# Patient Record
Sex: Female | Born: 1980 | Race: White | Hispanic: No | Marital: Single | Smoking: Never smoker
Health system: Southern US, Community
[De-identification: ages and names within clinical notes are randomized; demographics above are authoritative.]

## PROBLEM LIST (undated history)

## (undated) DIAGNOSIS — J302 Other seasonal allergic rhinitis: Secondary | ICD-10-CM

## (undated) DIAGNOSIS — M2652 Limited mandibular range of motion: Secondary | ICD-10-CM

## (undated) DIAGNOSIS — J45909 Unspecified asthma, uncomplicated: Secondary | ICD-10-CM

## (undated) DIAGNOSIS — M25371 Other instability, right ankle: Secondary | ICD-10-CM

## (undated) DIAGNOSIS — F329 Major depressive disorder, single episode, unspecified: Secondary | ICD-10-CM

## (undated) DIAGNOSIS — F419 Anxiety disorder, unspecified: Secondary | ICD-10-CM

## (undated) DIAGNOSIS — I10 Essential (primary) hypertension: Secondary | ICD-10-CM

## (undated) DIAGNOSIS — F909 Attention-deficit hyperactivity disorder, unspecified type: Secondary | ICD-10-CM

## (undated) DIAGNOSIS — F32A Depression, unspecified: Secondary | ICD-10-CM

## (undated) HISTORY — DX: Major depressive disorder, single episode, unspecified: F32.9

## (undated) HISTORY — DX: Depression, unspecified: F32.A

## (undated) HISTORY — DX: Attention-deficit hyperactivity disorder, unspecified type: F90.9

## (undated) HISTORY — DX: Anxiety disorder, unspecified: F41.9

## (undated) HISTORY — PX: LASIK: SHX215

---
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
## 2002-05-07 ENCOUNTER — Observation Stay (HOSPITAL_COMMUNITY): Admission: RE | Admit: 2002-05-07 | Discharge: 2002-05-08 | Payer: Self-pay | Admitting: Oral Surgery

## 2002-05-07 HISTORY — PX: MANDIBULAR SAGITTAL SPLIT OSTEOTOMY W/ INTERNAL FIXATION: SHX1998

## 2002-05-07 HISTORY — PX: MAXILLARY LE FORTE I OSTEOTOMY: SHX2005

## 2006-09-27 ENCOUNTER — Emergency Department (HOSPITAL_COMMUNITY): Admission: EM | Admit: 2006-09-27 | Discharge: 2006-09-27 | Payer: Self-pay | Admitting: Emergency Medicine

## 2008-11-21 ENCOUNTER — Emergency Department (HOSPITAL_BASED_OUTPATIENT_CLINIC_OR_DEPARTMENT_OTHER): Admission: EM | Admit: 2008-11-21 | Discharge: 2008-11-21 | Payer: Self-pay | Admitting: Emergency Medicine

## 2009-07-25 ENCOUNTER — Ambulatory Visit (HOSPITAL_COMMUNITY): Admission: RE | Admit: 2009-07-25 | Discharge: 2009-07-25 | Payer: Self-pay | Admitting: Surgery

## 2009-07-25 ENCOUNTER — Encounter (INDEPENDENT_AMBULATORY_CARE_PROVIDER_SITE_OTHER): Payer: Self-pay | Admitting: Surgery

## 2009-07-25 HISTORY — PX: CHOLECYSTECTOMY: SHX55

## 2010-07-07 ENCOUNTER — Emergency Department (HOSPITAL_COMMUNITY): Admission: EM | Admit: 2010-07-07 | Discharge: 2010-07-07 | Payer: Self-pay | Admitting: Emergency Medicine

## 2011-02-26 LAB — HEMOGLOBIN AND HEMATOCRIT, BLOOD
HCT: 41.5 % (ref 36.0–46.0)
Hemoglobin: 13.8 g/dL (ref 12.0–15.0)

## 2011-02-26 LAB — PREGNANCY, URINE: Preg Test, Ur: NEGATIVE

## 2011-04-09 NOTE — Discharge Summary (Signed)
[facility]  Patient:    Joy Walters, SEDAM Visit Number: [id number] MRN: [id number]          Service Type: SUR Location: 4W 0455 01 Attending Physician:  Luis Abed Dictated by:   Dionne Ano. Gwyneth Sprout., D.D.S. Admit Date:  05/07/2002 Discharge Date: 05/08/2002                             Discharge Summary  HISTORY OF PRESENT ILLNESS:  This 30 year old female was admitted to [facility] on 05/07/02, with a primary diagnosis of maxillary transverse deficiency, maxillary asymmetry, maxillary camp, mandibular sagittal deficiency, mandibular asymmetry, with severe class III malocclusion.  Her history and physical was completed showing radiographic and clinical evidence of severe class III malocclusion, unable to be treated with orthodontic means alone, and for a complete review of the physical findings, see the admission note.  ADMISSION LABORATORY DATA:  Her red blood cells were 4.81, hemoglobin 14.0, hematocrit 41.6.  She was typed as RH negative.  ABO group O, and antibody screen was negative.  SURGERY:  The patient was taken to surgery on the day of admission, and under general anesthesia an LeFort I maxillary osteotomy with advancement in rotation and rigid fixation was completed and a bilateral sagittal split osteotomy mandible with posterior repositioning and rotation was completed. The patient tolerated the surgery and the anesthesia without complications.  HOSPITAL COURSE:  Postoperatively, she has been taking fluid well, and ambulating with assistance, and is now discharged to be followed as an outpatient in our office.  Home care instructions have been reviewed with the patient and her mother and father, including complete dietary instructions for full liquid diet, meticulous oral hygiene, and activity is limited to light activity with supervision.  DISCHARGE MEDICATIONS: 1. Keflex 500 mg one tablet q.i.d. 2.  Percocet one tablet q.4h. p.r.n. pain. 3. Allegra one tablet b.i.d. 4. Decadron one tablet t.i.d. 5. Peridex oral rinse 10 cc rinse and spit out b.i.d.  FINAL DIAGNOSES: 1. 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 2. Maxillary asymmetry with deviation of the maxillary midline to the left. 3. Maxillary camp with the right side being lower than the left side. 4. 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 5. Mandibular asymmetry with deviation of the mandibular skeletal and midline    to the right. Dictated by:   Dionne Ano. Gwyneth Sprout., D.D.S. Attending Physician:  Luis Abed DD:  05/08/02 TD:  05/09/02 Job: [id number]

## 2011-04-09 NOTE — Op Note (Signed)
[facility]  Patient:    Joy Walters, Joy Walters Visit Number: [id number] MRN: [id number]          Service Type: SUR Location: 4W 0455 01 Attending Physician:  Luis Abed Dictated by:   Dionne Ano. Gwyneth Sprout., D.D.S. Proc. Date: 05/07/02 Admit Date:  05/07/2002 Discharge Date: 05/08/2002                             Operative Report  PREOPERATIVE DIAGNOSES: 1. Maxillary sagittal deficiency with class III malocclusion. 2. Maxillary asymmetry with deviation of maxillary midline to the left. 3. Maxillary cant with the right side being lower than the left side. 4. 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 5. Mandibular asymmetry with deviation of the mandibular midline to the right.  POSTOPERATIVE DIAGNOSES: 1. Maxillary sagittal deficiency with class III malocclusion. 2. Maxillary asymmetry with deviation of maxillary midline to the left. 3. Maxillary cant with the right side being lower than the left side. 4. 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 5. Mandibular asymmetry with deviation of the mandibular midline to the right.  PROCEDURE:  Jerry Caras I maxillary osteotomy with advancement and rigid fixation and bilateral sagittal split osteotomy to the mandible with posterior repositioning, rotation, and rigid fixation.  SURGEON:  Dionne Ano. Gwyneth Sprout., D.D.S. and Cherly Anderson, D.D.S.  INDICATION FOR PROCEDURE:  This 30 year old female has been followed as an outpatient in our office and has been undergoing orthodontic care with Dr. Rosanne Ashing ______.  After orthodontic alignment of the maxillary and mandibular teeth in each individual chart, cephalometric x-ray analysis, surgical analysis, model analysis was completed, indicating she has severe class III malocclusion that could not be corrected with orthodontic procedures alone with an underbite of greater than 11 mm.  She also shows significant asymmetry of the  maxilla and mandible which could also not be addressed with orthodontics alone.  Presurgical study models were completed.  Simulations of the surgical procedures were completed, and the surgical splints were fabricated for the surgical procedure.  PROCEDURE AND FINDINGS:  The patient brought to the operating suite and placed supine and positioned on the operating table.  After satisfactory nasal tracheal anesthesia was achieved, the patients oral cavity was prepped by placing a ______  throat pack into the oropharynx and prepped with a Betadine scrub and Betadine paint, and the face was painted with Betadine.  The patient was draped with four towels but no lap sheet.  Bilateral inferior alveolar and lingula nerve blocks were achieved for postoperative comfort and hemostasis.  Using electrocautery, an incision was made along the external oblique ridge of the right mandible extending from the mid portion of the anterior lead to the second molar tooth.  The incision was carried through the mucosa, submucosa, buccinator muscle periosteum down to bone.  Full-thickness mucoperiosteal flap was elevated superiorly to the glenoid process and medially, identifying the lingula of the mandible and inferior to the inferior border of the mandible and the first molar area. Using a rotary osteotome with the [facility] bur, a horizontal bone cut was made just superior to the lingula through the medial cortical plate, and a sagittal cut was completed with a 701 bur between the external and internal oblique ridges, and anteriorly and inferiorly at the second molar tooth.  Then a vertical cut was made through the inferior border of the mandible and along the lateral cortex of the mandible superiorly to connect with the  sagittal cut. Both sides were completed in a similar fashion.  No attempt was made to decide who would split the mandible at this point.  Attention was turned to the maxilla and  posterosuperior alveolar nerve block was completed with 0.5% Marcaine with 1:200,000 epinephrine and infiltration along the lateral aspect of the maxilla for hemostasis.  Using electrocautery, incision was made through the mucosa, submucosa, buccinator muscle. Periosteum down to bone from the right zygomatic buttress anteriorly to the midline completed on both sides.  The periosteum was stripped superiorly along the piriform rim and to the infraorbital foramen and posterior long the posterior wall with axilla to the pterygoid place.  This was completed on both sides, and marking holes were placed on the piriform rim and the zygomatic buttress at exactly 10 mm apart, and a horizontal bone cut was made to the axilla above the apices of the teeth approximately 10 mm through the lateral wall of the fattest to connect with the lateral wall of the nose parallel to the plane of occlusion.  A posterior bone cut was made on both left and right side using the sagittal saw to the pterygoid plates.  Mallet and chisel were then used to separate the medial wall of the maxilla to the pterygoid plates. A nasoseptal chisel was used to remove the nasal septum and vomer bone from the perpendicular plate of the palate and then a pterygomaxillary chisel was used to separate the pterygoid plates from the posterior wall of the maxilla. This allowed the maxilla to be down-fractured in the traditional sense and mobilized with Tessier disimpaction forceps.  After mobilization, the maxilla could easily be advanced approximately 6 mm on the right side and 5 mm on the left side.  Using an intermediate surgical splint that was placed over the teeth, the maxilla could then advanced approximately 6 mm on the right side and 5 mm on the left side and rotated 1 mm to the right, and the cant was leveled.  The maxilla was then repositioned superiorly and fit exactly as planned with a step in the anterior wall of the  maxilla.  Great care was taken to thoroughly irrigate the maxillary sinuses.  The small separation for the nose was sutured with multiple interrupted 4-0 gut sutures,  and the patient was placed into the intermediate splint and into intermaxillary fixation with the condyle seated in the most superior portion of glenoid fossa.  The maxilla was rotated superiorly to contact with the bone.  The bone plates were fabricated, and rigid fixation was applied in the piriform rim and the zygomatic buttress areas on both sides.  The intermaxillary fixation was removed.  The patient all rotated into the splint exactly, and the occlusion was exactly as planned on the surgical models with the intermediate phase of the surgical procedure.  Attention was then carried out to both the sagittal split osteotomies on both the left and right side.  These were completed and mallet and chisels and progressing to larger chisels, and the final split was completed with the [facility] splitting instrument.  Both sides of the inferior alveolar nerve vascular bundle was contained in the proximal segment and had to be dissected free and moved to the distal segment.  This allowed posterior repositioning of the mandible in its class I anatomical position, and this was verified by the final surgical splint and wiring the teeth together.  Attention was then carried out to the osteotomy segments, and the lateral segment on both the  left and right side was readapted to the new position of the mandible by morselizing the bone cuts and removing approximately 4-5 mm of bone in the anterior direction.  The segments were clamped together in a noncompression fashion using a modified Allis clamp with the condyle seated in the most superior portion of the glenoid fossa and transosseous 2 mm titanium screws from the KLS bone system were used to rigidly fixate the left and right ramus of the mandible using two screws on each side.   The stability was checked and seen go be good, and the intermaxillary fixation was removed to verify the exact position with the condyle seated in the most superior point of the glenoid fossa.  The mandible auto-rotated exactly into the surgical splint.  The surgical splint was removed, and the occlusion was checked and was seen to be exactly as planned on the surgical models.  The posterior osteotomy sites were thoroughly irrigated and closed with one interrupted 3-0 Vicryl suture.  The maxillary incision was closed with 8R cinch using a 2-0 Vicryl suture and a V-Y closure in the anterior maxillary mucosa using a 4-0 gut suture, and both lateral incisions were closed with 4-0 Vicryl sutures.  The patient was awakened and taken to the recovery room in stable condition, tolerating the surgery and the anesthesia without complications.  Prior to removing the patient to the recovery room, the throat pack was removed.  Estimated blood loss was 400 cc.  Tissue removed was none. Dictated by:   Dionne Ano. Gwyneth Sprout., D.D.S. Attending Physician:  Luis Abed DD:  05/07/02 TD:  05/08/02 Job: [id number]

## 2013-06-28 ENCOUNTER — Other Ambulatory Visit: Payer: Self-pay | Admitting: Family Medicine

## 2013-06-28 ENCOUNTER — Other Ambulatory Visit (HOSPITAL_COMMUNITY)
Admission: RE | Admit: 2013-06-28 | Discharge: 2013-06-28 | Disposition: A | Discharge status: Home / Self Care | Payer: BC Managed Care – PPO | Source: Ambulatory Visit | Attending: Family Medicine | Admitting: Family Medicine

## 2013-06-28 DIAGNOSIS — Z0000 Encounter for general adult medical examination without abnormal findings: Secondary | ICD-10-CM | POA: Insufficient documentation

## 2014-03-11 ENCOUNTER — Encounter (HOSPITAL_COMMUNITY): Payer: Self-pay

## 2014-03-11 ENCOUNTER — Other Ambulatory Visit (HOSPITAL_COMMUNITY): Payer: BC Managed Care – PPO | Attending: Psychiatry | Admitting: Psychiatry

## 2014-03-11 DIAGNOSIS — F411 Generalized anxiety disorder: Secondary | ICD-10-CM

## 2014-03-11 DIAGNOSIS — F339 Major depressive disorder, recurrent, unspecified: Secondary | ICD-10-CM

## 2014-03-11 DIAGNOSIS — F909 Attention-deficit hyperactivity disorder, unspecified type: Secondary | ICD-10-CM

## 2014-03-11 MED ORDER — METHYLPHENIDATE HCL ER (OSM) 27 MG PO TBCR: 27.0000 mg | EXTENDED_RELEASE_TABLET | Freq: Every day | ORAL | Status: AC

## 2014-03-11 NOTE — Progress Notes (Signed)
Joy Walters is a 33 y.o., single, Caucasian, female, who was referred per PCP (Dr. Laurann Montanaynthia White), treatment for ongoing depressive and anxiety symptoms.  Denies HI or A/V hallucinations.  Admits to passive SI.  Discussed safety options with patient and she is able to contract for safety.  Denies any previous suicide gestures or attempts.  Denies any psychiatric hospitalizations.  Has seen a psychiatrist at age 33 (ADHD).  According to pt, her symptoms started to worsen in September 2014, whenever her blood pressure was elevated.  Symptoms include increased sleep (8-10 hrs), decreased energy and motivation, tearfulness, anhedonia, and increased irritability.  Stressor:  1)  Job ([employer]) of nine years.  Patient is a Hotel managerwaitress/server.  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  "A new menu has rolled out.  I've been trying to get a promotion, but they keep over looking me."  Apparently, patient's younger brother requested a promotion and he got it.  2)  Medical Issues:  Asthma, HTN, seasonal allergies, hay fever, and rt ankle pain.   Family Hx:   Father suffers with depression and OCD.  Mother suffers with depression also. Childhood:  Born in [place] till age 33 when [place] to [place], [place].  States childhood was "good."  Diagnosed with ADHD at age 89, but was medicated until high school.  Denies any trauma/abuse. Sibling:  Younger brother Pt graduated from [employer] in 2008, with a BS in business administration. Pt resides with her parents.  Reports that her parents and brother are her support system.  Not dating anyone. Denies any drugs/ETOH or legal issues.  Never smoked cigarettes. Pt completed all forms.  Scored 13 on the burns.  Pt will attend [facility]-IOP for ten days.  A:  Oriented pt.  Provided pt with an orientation folder.  Informed Dr. Cliffton AstersWhite of admit.  Will refer pt to a therapist.  Encouraged support groups.  R:  Pt receptive.

## 2014-03-12 ENCOUNTER — Other Ambulatory Visit (HOSPITAL_COMMUNITY): Payer: 59 | Attending: Psychiatry | Admitting: Psychiatry

## 2014-03-12 ENCOUNTER — Encounter (HOSPITAL_COMMUNITY): Payer: Self-pay | Admitting: Psychiatry

## 2014-03-12 DIAGNOSIS — R45851 Suicidal ideations: Secondary | ICD-10-CM | POA: Insufficient documentation

## 2014-03-12 DIAGNOSIS — I10 Essential (primary) hypertension: Secondary | ICD-10-CM | POA: Insufficient documentation

## 2014-03-12 DIAGNOSIS — F909 Attention-deficit hyperactivity disorder, unspecified type: Secondary | ICD-10-CM | POA: Insufficient documentation

## 2014-03-12 DIAGNOSIS — F411 Generalized anxiety disorder: Secondary | ICD-10-CM | POA: Insufficient documentation

## 2014-03-12 DIAGNOSIS — J45909 Unspecified asthma, uncomplicated: Secondary | ICD-10-CM | POA: Insufficient documentation

## 2014-03-12 DIAGNOSIS — Z91013 Allergy to seafood: Secondary | ICD-10-CM | POA: Insufficient documentation

## 2014-03-12 DIAGNOSIS — G4700 Insomnia, unspecified: Secondary | ICD-10-CM | POA: Insufficient documentation

## 2014-03-12 DIAGNOSIS — F332 Major depressive disorder, recurrent severe without psychotic features: Secondary | ICD-10-CM | POA: Insufficient documentation

## 2014-03-12 NOTE — Progress Notes (Signed)
    Daily Group Progress Note  Program: IOP  Group Time: 9:00-10:30  Participation Level: Active  Behavioral Response: Appropriate  Type of Therapy:  Group Therapy  Summary of Progress: Pt. Participated in meditation. Pt. Reported feeling distracted and difficulty focusing because of noise outside of the room. Pt. Appropriately discussed significant work stress. Pt. Reported feeling empowered to ask for the accomodations that she needs at work such as requesting an alternate schedule and placements that are less stressful.   Group Time: 10:30-12:00  Participation Level:  Active  Behavioral Response: Appropriate  Type of Therapy: Psycho-education Group  Summary of Progress: Pt. Participated in session focused on developing healthy relationship boundaries.  [facility]

## 2014-03-13 ENCOUNTER — Other Ambulatory Visit (HOSPITAL_COMMUNITY): Payer: 59 | Admitting: Psychiatry

## 2014-03-13 ENCOUNTER — Encounter (HOSPITAL_COMMUNITY): Payer: Self-pay | Admitting: Psychiatry

## 2014-03-13 DIAGNOSIS — F909 Attention-deficit hyperactivity disorder, unspecified type: Secondary | ICD-10-CM

## 2014-03-13 DIAGNOSIS — F339 Major depressive disorder, recurrent, unspecified: Secondary | ICD-10-CM

## 2014-03-13 NOTE — Progress Notes (Signed)
Psychiatric Assessment Adult  Patient Identification:  Joy DandyElizabeth A Walters Date of Evaluation:  03/13/2014 Chief Complaint: Depression and anxiety History of Chief Complaint:  33 y.o., single, Caucasian, female, who was referred per PCP (Dr. Laurann Montanaynthia White), treatment for ongoing depressive and anxiety symptoms. Denies HI or A/V hallucinations. Admits to passive SI. Discussed safety options with patient and she is able to contract for safety. Denies any previous suicide gestures or attempts. Denies any psychiatric hospitalizations. Has seen a psychiatrist at age 33 (ADHD). According to pt, her symptoms started to worsen in September 2014, when her blood pressure was elevated. Symptoms include increased sleep (8-10 hrs), decreased energy and motivation, tearfulness, anhedonia, and increased irritability. Stressor: 1) Job ([employer]) of nine years. 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 "A new menu has rolled out. I've been trying to get a promotion, but they keep over looking me." Apparently, patient's younger brother requested a promotion and he got it. Patient feels bad about this. Her PCP discontinued her Effexor 3 days ago and started her on Cymbalta 60 mg every day and she is tolerating it well so far. Marland Kitchen. 2) Medical Issues: Asthma, HTN, seasonal allergies, hay fever, and rt ankle pain.  Family Hx: Father suffers with depression and OCD. Mother suffers with depression also.  Childhood: Born in [place] till age [age] when she moved to [place], [place]. States childhood was "good." Diagnosed with ADHD at age 89, but was medicated until high school. Denies any trauma/abuse.  Sibling: Younger brother  Pt graduated from [employer] in 2008, with a BS in business administration.  Pt resides with her parents. Reports that her parents and brother are her support system. Not dating anyone.  Denies any drugs/ETOH or legal issues. Never smoked cigarettes   HPI Review of  Systems Physical Exam  Depressive Symptoms: depressed mood, anhedonia, insomnia, psychomotor retardation, feelings of worthlessness/guilt, difficulty concentrating, hopelessness, recurrent thoughts of death, increased appetite,  (Hypo) Manic Symptoms:  None  Anxiety Symptoms: Excessive Worry:  Yes Panic Symptoms:  No Agoraphobia:  No Obsessive Compulsive: No  Symptoms: None, Specific Phobias:  No Social Anxiety:  No  Psychotic Symptoms: None  PTSD Symptoms: None  Traumatic Brain Injury: No   Past Psychiatric History: Diagnosis: ADHD and depression   Hospitalizations:   Outpatient Care: Patient saw a psychiatrist when she was 16 for ADHD. Currently does not have a psychiatrist   Substance Abuse Care:   Self-Mutilation:   Suicidal Attempts:   Violent Behaviors:    Past Medical History:   Past Medical History  Diagnosis Date  . Anxiety   . Depression   . ADHD (attention deficit hyperactivity disorder)    History of Loss of Consciousness:  No Seizure History:  No Cardiac History:  No Allergies:   Allergies  Allergen Reactions  . Shellfish Allergy   . Sulphur [Sulfur]    Current Medications:  Current Outpatient Prescriptions  Medication Sig Dispense Refill  . atomoxetine (STRATTERA) 100 MG capsule Take 100 mg by mouth daily.      . DULoxetine (CYMBALTA) 60 MG capsule Take 60 mg by mouth daily.      Marland Kitchen. losartan (COZAAR) 50 MG tablet Take 50 mg by mouth daily.      . methylphenidate (CONCERTA) 27 MG CR tablet Take 1 tablet (27 mg total) by mouth daily.  30 tablet  0  . montelukast (SINGULAIR) 10 MG tablet Take 10 mg by mouth at bedtime.  No current facility-administered medications for this visit.    Previous Psychotropic Medications:  Medication Dose                          Substance Abuse History in the last 12 months: None Substance Age of 1st Use Last Use Amount Specific Type  Nicotine      Alcohol      Cannabis      Opiates       Cocaine      Methamphetamines      LSD      Ecstasy      Benzodiazepines      Caffeine      Inhalants      Others:                          Medical Consequences of Substance Abuse:   Legal Consequences of Substance Abuse:   Family Consequences of Substance Abuse:   Blackouts:  No DT's:  No Withdrawal Symptoms:  No None  Social History: Current Place of Residence:  Place of Birth:  Family Members:  Marital Status:  Single Children: 0  Sons:   Daughters:  Relationships:  Education:  [employer] production [employer] Educational Problems/Performance:  Religious Beliefs/Practices:  History of Abuse: none Teacher, musicccupational Experiences; Military History:  None. Legal History: none Hobbies/Interests:   Family History:   Family History  Problem Relation Age of Onset  . Depression Mother   . Depression Father   . OCD Father     Mental Status Examination/Evaluation: Objective:  Appearance: Casual  Eye Contact::  Fair  Speech:  Clear and Coherent and Normal Rate  Volume:  Normal  Mood:  Depressed and anxious   Affect:  Appropriate  Thought Process:  Goal Directed, Linear and Logical  Orientation:  Full (Time, Place, and Person)  Thought Content:  Rumination  Suicidal Thoughts:  No  Homicidal Thoughts:  No  Judgement:  Good  Insight:  Good  Psychomotor Activity:  Normal  Akathisia:  No  Handed:  Right  AIMS (if indicated):  0  Assets:  Communication Skills Desire for Improvement Physical Health Resilience Social Support    Laboratory/X-Ray Psychological Evaluation(s)          AXIS I ADHD, combined type, Anxiety Disorder NOS and Major Depression, Recurrent severe  AXIS II Cluster C Traits  AXIS III Past Medical History  Diagnosis Date  . Anxiety   . Depression   . ADHD (attention deficit hyperactivity disorder)      AXIS IV educational problems, other psychosocial or environmental problems, problems related to social environment and problems with primary support  group  AXIS V 51-60 moderate symptoms   Treatment Plan/Recommendations:  Plan of Care:   Laboratory:  None at this time  Psychotherapy: Group individual therapy   Medications: Patient will into will continue Strattera 100 mg every day, Cymbalta 60, Cozaar 50 and Singulair 10 mg. I discussed rationale risks benefits options off Concerta 27 mg every day for her ADHD and patient gave me her informed consent she'll take the medicine prior to going to work to help her focus.   Routine PRN Medications:  Yes  Consultations:   Safety Concerns:  None   Other:  Estimated length of stay 2 weeks     Gayland CurryGayathri D Yvana Samonte, MD 4/22/201511:33 AM    1

## 2014-03-13 NOTE — Progress Notes (Signed)
    Daily Group Progress Note  Program: IOP  Group Time: 9:00-10:30  Participation Level: Active  Behavioral Response: Appropriate  Type of Therapy:  Group Therapy  Summary of Progress: Pt. Participated in meditation exercise. Pt. Appeared sleepy and lethargic for the first half of group. Pt. Attributes lethargy to change in schedule and that she is used to getting up at 10:00 due to her job schedule. Pt. Indicated that she is not challenged by issues related to perfectionism. Pt. Complained about allergies and being troubled by high pollen.     Group Time: 10:30-12:00  Participation Level:  Active  Behavioral Response: Appropriate  Type of Therapy: Psycho-education Group  Summary of Progress: Pt. Participated in group facilitated by Jake Churchhrista Whitesell with the mental health association.  Joy Walters, Joy Walters, COUNS

## 2014-03-14 ENCOUNTER — Other Ambulatory Visit (HOSPITAL_COMMUNITY): Payer: 59 | Admitting: Psychiatry

## 2014-03-14 DIAGNOSIS — F909 Attention-deficit hyperactivity disorder, unspecified type: Secondary | ICD-10-CM

## 2014-03-15 ENCOUNTER — Encounter (HOSPITAL_COMMUNITY): Payer: Self-pay | Admitting: Psychiatry

## 2014-03-15 ENCOUNTER — Other Ambulatory Visit (HOSPITAL_COMMUNITY): Payer: 59 | Admitting: Psychiatry

## 2014-03-15 DIAGNOSIS — F909 Attention-deficit hyperactivity disorder, unspecified type: Secondary | ICD-10-CM

## 2014-03-15 NOTE — Progress Notes (Signed)
    Daily Group Progress Note  Program: IOP  Group Time: 9:00-10:30  Participation Level: Active  Behavioral Response: Appropriate  Type of Therapy:  Group Therapy  Summary of Progress: Pt. Participated in meditation. Pt. Appears to be distracted during the group process. Pt. Reports that issues that rise during group discussions do not relate to her such as developing healthy relationship boundaries, ability to accept self, development of self-compassion. Pt. Talks about job stress; however, she discusses how she was able to request suitable accomodations from her employer and that she is doing much better at work and stress level has decreased.     Group Time: 10:30-12:00  Participation Level:  Active  Behavioral Response: Appropriate  Type of Therapy: Psycho-education Group  Summary of Progress: Pt. Participated in discussion about self-esteem development by focusing on the true  Self.  Shaune PollackBrown, Jennifer B, COUNS

## 2014-03-15 NOTE — Progress Notes (Signed)
    Daily Group Progress Note  Program: IOP  Group Time: 9:00-10:30  Participation Level: Active  Behavioral Response: Appropriate  Type of Therapy:  Group Therapy  Summary of Progress: Pt. Participated in meditation practice. Pt. Continues to go to work following group. Pt. Reports that she is effectively managing stress at work and at home.      Group Time: 10:30-12:00  Participation Level:  Active  Behavioral Response: Appropriate  Type of Therapy: Psycho-education Group  Summary of Progress: Pt. Participated in discussion about passive, aggressive, and assertive communication styles.  [facility]

## 2014-03-18 ENCOUNTER — Encounter (HOSPITAL_COMMUNITY): Payer: Self-pay | Admitting: Psychiatry

## 2014-03-18 ENCOUNTER — Other Ambulatory Visit (HOSPITAL_COMMUNITY): Payer: 59 | Admitting: Psychiatry

## 2014-03-18 DIAGNOSIS — F909 Attention-deficit hyperactivity disorder, unspecified type: Secondary | ICD-10-CM

## 2014-03-18 NOTE — Progress Notes (Signed)
    Daily Group Progress Note  Program: IOP  Group Time: 9:00-10:30  Participation Level: Active  Behavioral Response: Appropriate  Type of Therapy:  Group Therapy  Summary of Progress: Pt. Participated in meditation practice. Pt. 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     Group Time: 10:30-12:00  Participation Level:  Active  Behavioral Response: Appropriate  Type of Therapy: Psycho-education Group  Summary of Progress: Pt. Participated in grief and loss group facilitated by Theda BelfastBob Hamilton.  Shaune PollackBrown, Jennifer B, COUNS

## 2014-03-19 ENCOUNTER — Other Ambulatory Visit (HOSPITAL_COMMUNITY): Payer: 59 | Admitting: Psychiatry

## 2014-03-19 ENCOUNTER — Encounter (HOSPITAL_COMMUNITY): Payer: Self-pay | Admitting: Psychiatry

## 2014-03-19 DIAGNOSIS — F909 Attention-deficit hyperactivity disorder, unspecified type: Secondary | ICD-10-CM

## 2014-03-19 NOTE — Progress Notes (Signed)
    Daily Group Progress Note  Program: IOP  Group Time: 9:00-10:30  Participation Level: Active  Behavioral Response: Appropriate  Type of Therapy:  Group Therapy  Summary of Progress: Pt. Participated in meditation exercise. Pt. Smiles and laughs appropriately. Pt. Reported that she was feeling "good" and slept well with 7 1/2 hours of sleep. Pt. Reports that she is off work this week, but when she was at work was meeting tasks with minimal stress and able to make requests of her managers with no distress. Pt. Reports that she is spending her week cleaning up her room and organizing at home.     Group Time: 10:30-12:00  Participation Level:  Active  Behavioral Response: Appropriate  Type of Therapy: Psycho-education Group  Summary of Progress: Pt. Participated in discussion about identifying areas of creativity and facing our fears about self-expression.  Shaune PollackBrown, Jennifer B, COUNS

## 2014-03-20 ENCOUNTER — Other Ambulatory Visit (HOSPITAL_COMMUNITY): Payer: 59 | Admitting: Psychiatry

## 2014-03-20 DIAGNOSIS — F909 Attention-deficit hyperactivity disorder, unspecified type: Secondary | ICD-10-CM

## 2014-03-21 ENCOUNTER — Other Ambulatory Visit (HOSPITAL_COMMUNITY): Payer: 59 | Admitting: Psychiatry

## 2014-03-21 ENCOUNTER — Encounter (HOSPITAL_COMMUNITY): Payer: Self-pay | Admitting: Psychiatry

## 2014-03-21 DIAGNOSIS — F331 Major depressive disorder, recurrent, moderate: Secondary | ICD-10-CM

## 2014-03-21 DIAGNOSIS — F411 Generalized anxiety disorder: Secondary | ICD-10-CM

## 2014-03-21 NOTE — Progress Notes (Signed)
    Daily Group Progress Note  Program: IOP  Group Time: 9:00-10:30  Participation Level: Active  Behavioral Response: Appropriate  Type of Therapy:  Group Therapy  Summary of Progress: Pt. Participated in meditation exercise. Pt. Continues to report "good" mood and and she is able to use positive coping skills. Pt. Reports few stressors other than work environment. Pt. Reports that she enjoys her work and has been able to request adequate accomodations from her employer in order to better cope with work stressors.     Group Time: 10:30-12:00  Participation Level:  Active  Behavioral Response: Appropriate  Type of Therapy: Psycho-education Group  Summary of Progress: Pt. Participated in discussion about identifying cognitive distortions.  Shaune PollackBrown, Jennifer B, COUNS

## 2014-03-21 NOTE — Progress Notes (Signed)
    Daily Group Progress Note  Program: IOP  Group Time: 9:00-10:30  Participation Level: Active  Behavioral Response: Appropriate  Type of Therapy:  Group Therapy  Summary of Progress: Pt. Participated in meditation exercise. Pt. Reported that she continues to cope well at work and at home. Pt. Reported that her mother has been away on business trip out of the country and she was looking forward to spending time with her before she leaves again. Pt. Is challenged to connect with the group and prominent themes of other group members, tends to deflect and report that topics such as self-esteem, boundaries, coping strategies do not apply to her life.     Group Time: 10:30-12:00  Participation Level:  Active  Behavioral Response: Appropriate  Type of Therapy: Psycho-education Group  Summary of Progress: Pt. Participated in discussion about how loneliness, failure, rejection, and rumination contribute to emotional health.  Shaune PollackBrown, Lashica Hannay B, COUNS

## 2014-03-22 ENCOUNTER — Other Ambulatory Visit (HOSPITAL_COMMUNITY): Payer: 59 | Attending: Psychiatry | Admitting: Psychiatry

## 2014-03-22 ENCOUNTER — Encounter (HOSPITAL_COMMUNITY): Payer: Self-pay | Admitting: Psychiatry

## 2014-03-22 DIAGNOSIS — R45851 Suicidal ideations: Secondary | ICD-10-CM | POA: Insufficient documentation

## 2014-03-22 DIAGNOSIS — J45909 Unspecified asthma, uncomplicated: Secondary | ICD-10-CM | POA: Insufficient documentation

## 2014-03-22 DIAGNOSIS — F332 Major depressive disorder, recurrent severe without psychotic features: Secondary | ICD-10-CM | POA: Insufficient documentation

## 2014-03-22 DIAGNOSIS — M25371 Other instability, right ankle: Secondary | ICD-10-CM

## 2014-03-22 DIAGNOSIS — F909 Attention-deficit hyperactivity disorder, unspecified type: Secondary | ICD-10-CM | POA: Insufficient documentation

## 2014-03-22 DIAGNOSIS — F331 Major depressive disorder, recurrent, moderate: Secondary | ICD-10-CM

## 2014-03-22 DIAGNOSIS — G4700 Insomnia, unspecified: Secondary | ICD-10-CM | POA: Insufficient documentation

## 2014-03-22 DIAGNOSIS — F411 Generalized anxiety disorder: Secondary | ICD-10-CM | POA: Insufficient documentation

## 2014-03-22 DIAGNOSIS — Z91013 Allergy to seafood: Secondary | ICD-10-CM | POA: Insufficient documentation

## 2014-03-22 DIAGNOSIS — I10 Essential (primary) hypertension: Secondary | ICD-10-CM | POA: Insufficient documentation

## 2014-03-22 HISTORY — DX: Other instability, right ankle: M25.371

## 2014-03-22 NOTE — Progress Notes (Signed)
    Daily Group Progress Note  Group Time: 9:00-10:30   Participation Level: Active   Behavioral Response: Appropriate   Type of Therapy: Group Therapy   Summary of Progress: Pt. Participated in meditation exercise. Pt. Reported that she is looking forward to working this weekend. Pt. Reported that she is able to build consistency in her day by eating breakfast at the same time everyday and leaving work 45 minutes ahead of scheduled work time everyday. Pt. Reports good stress management and assertiveness skills. Pt. Is challenged in social interactions but has low motivation for increasing level of social interaction.  Group Time: 10:30-12:00   Participation Level: Active   Behavioral Response: Appropriate   Type of Therapy: Psycho-education Group   Summary of Progress: Pt. Participated in discussion about the neuropsychology of laughter and how laughter can be used as an emotion regulation tool.   Shaune PollackBrown, Jasai Sorg B, COUNS

## 2014-03-25 ENCOUNTER — Other Ambulatory Visit (HOSPITAL_COMMUNITY): Payer: 59

## 2014-03-25 ENCOUNTER — Telehealth (HOSPITAL_COMMUNITY): Payer: Self-pay | Admitting: Psychiatry

## 2014-03-26 ENCOUNTER — Encounter (HOSPITAL_COMMUNITY): Payer: Self-pay | Admitting: Psychiatry

## 2014-03-26 ENCOUNTER — Other Ambulatory Visit (HOSPITAL_COMMUNITY): Payer: 59 | Admitting: Psychiatry

## 2014-03-26 DIAGNOSIS — F331 Major depressive disorder, recurrent, moderate: Secondary | ICD-10-CM

## 2014-03-26 NOTE — Progress Notes (Signed)
    Daily Group Progress Note  Program: IOP  Group Time: 9:00-10:30  Participation Level: Active  Behavioral Response: Appropriate  Type of Therapy:  Group Therapy  Summary of Progress: Pt. Participated in meditation exercise. Pt. Reported that work and home life are manageable. Pt. Indicated that she is looking forward to discharge tomorrow.     Group Time: 10:30-12:00  Participation Level:  Active  Behavioral Response: Appropriate  Type of Therapy: Psycho-education Group  Summary of Progress: Pt. Participated in discussion about meaning making and identity formation following a crisis.  Shaune PollackBrown, Kahlee Metivier B, COUNS

## 2014-03-27 ENCOUNTER — Other Ambulatory Visit (HOSPITAL_COMMUNITY): Payer: 59 | Admitting: Psychiatry

## 2014-03-27 ENCOUNTER — Encounter (HOSPITAL_COMMUNITY): Payer: Self-pay | Admitting: Psychiatry

## 2014-03-27 DIAGNOSIS — F331 Major depressive disorder, recurrent, moderate: Secondary | ICD-10-CM

## 2014-03-27 DIAGNOSIS — F909 Attention-deficit hyperactivity disorder, unspecified type: Secondary | ICD-10-CM

## 2014-03-27 NOTE — Progress Notes (Signed)
Discharge Note  Patient:  Joy Walters is an 33 y.o., female DOB:  18-Jul-1981  Date of Admission: He 03/11/14  Date of Discharge: 03/27/14  Reaso n for Admission:32 y.o., single, Caucasian, female, who was referred per PCP (Dr. Laurann Montanaynthia White), treatment for ongoing depressive and anxiety symptoms. Denies HI or A/V hallucinations. Admits to passive SI. Discussed safety options with patient and she is able to contract for safety. Denies any previous suicide gestures or attempts. Denies any psychiatric hospitalizations. Has seen a psychiatrist at age 33 (ADHD). According to pt, her symptoms started to worsen in September 2014, when her blood pressure was elevated. Symptoms include increased sleep (8-10 hrs), decreased energy and motivation, tearfulness, anhedonia, and increased irritability. Stressor: 1) Job ([employer]) of nine years. 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 "A new menu has rolled out. I've been trying to get a promotion, but they keep over looking me." Apparently, patient's younger brother requested a promotion and he got it. Patient feels bad about this. Her PCP discontinued her Effexor 3 days ago and started her on Cymbalta 60 mg every day and she is tolerating it well so far.  Marland Kitchen. 2) Medical Issues: Asthma, HTN, seasonal allergies, hay fever, and rt ankle pain.  Family Hx: Father suffers with depression and OCD. Mother suffers with depression also.  Childhood: Born in [place] till age 33 when [place], [place]. States childhood was "good." Diagnosed with ADHD at age 89, but was medicated until high school. Denies any trauma/abuse.  Sibling: Younger brother  Pt graduated from [employer] in 2008, with a BS in business administration.  Pt resides with her parents. Reports that her parents and brother are her support system. Not dating anyone.  Denies any drugs/ETOH or legal issues. Never smoked cigarettes     Hospital Course: patient  started IOP and was continued on her Strattera, Cymbalta, Cozaar and Singulair. Because of her ADHD patient was started on Concerta which needed prior authorization and so finally she received it. Patient experienced mild tachycardia as the medicine was being absorbed into his system but tolerated it well. Sleep and appetite was good mood was good she was able to express her feelings in group and her concentration was good. She was coping well and was tolerating her medications well.   Mental Status at Discharge:alert, oriented x3, affect was appropriate mood was euthymic speech and language was normal, musculoskeletal was normal. No suicidal or homicidal ideation. No hallucinations or delusions. Recent and remote memory is good, judgment and insight is good, concentration and recall is good.   Lab Results: No results found for this or any previous visit (from the past 48 hour(s)).  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 Anxiety   . Depression   . ADHD (attention deficit hyperactivity  disorder)    Axis IV: occupational problems, problems related to social environment and problems with primary support group Axis V: 61-70 mild symptoms   Level of Care:  OP  Discharge destination:  Home  Is patient on multiple antipsychotic therapies at discharge:  No    Has Patient had three or  more failed trials of antipsychotic monotherapy by history:  No  Patient phone:  [phone] (home)  Patient address:   [address] [place] [id number],   Follow-up recommendations:  Activity:  As tolerated Diet:  Regular Other:  Followup for medications and therapy with Dr. Cliffton AstersWhite and Nancy MarusJoanieTomar    The patient received suicide prevention pamphlet:  No   Jaci StandardGayathri D Jayton Popelka 03/27/2014, 11:15 AM

## 2014-03-27 NOTE — Progress Notes (Signed)
    Daily Group Progress Note  Program: IOP  Group Time: 9:00-10:30  Participation Level: Active  Behavioral Response: Appropriate  Type of Therapy:  Group Therapy  Summary of Progress: Pt. Participated in meditation exercise. Pt. Reported that she was "good" and ready for discharge. Pt. Identified passion for law and helping others. Pt. 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     Group Time: 10:30-12:00  Participation Level:  Active  Behavioral Response: Appropriate  Type of Therapy: Psycho-education Group  Summary of Progress: Pt. Participated in discussion about the therapeutic affect of telling our stories and listening to the stories of others.  Shaune PollackBrown, Jennifer B, COUNS

## 2014-03-27 NOTE — Patient Instructions (Signed)
Patient completed MH-IOP today.  Will follow up with PCP (Dr. Cliffton AstersWhite) and Cleophas DunkerJoanie Tomar, LMFT on 04-30-14 @ 8 a.m.  Encouraged support groups.

## 2014-03-27 NOTE — Progress Notes (Signed)
Joy Walters is a 33 y.o. single, Caucasian, female, who was referred per PCP (Dr. Laurann Montanaynthia White), treatment for ongoing depressive and anxiety symptoms. Denied HI or A/V hallucinations. Admitted to passive SI. Discussed safety options with patient and she is able to contract for safety. Denied any previous suicide gestures or attempts. Denied any psychiatric hospitalizations. Had seen a psychiatrist at age 33 (ADHD). According to pt, her symptoms started to worsen in September 2014, whenever her blood pressure was elevated. Symptoms included increased sleep (8-10 hrs), decreased energy and motivation, tearfulness, anhedonia, and increased irritability. Stressors: 1) Job ([employer]) of nine years. 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 "A new menu has rolled out. I've been trying to get a promotion, but they keep over looking me." Apparently, patient's younger brother requested a promotion and he got it. 2) Medical Issues: Asthma, HTN, seasonal allergies, hay fever, and rt ankle pain.  Family Hx: Father suffers with depression and OCD. Mother suffers with depression also. Pt completed MH-IOP today.  Reports overall mood improved.  Reports sleeping 6-8 hrs at night, improved appetite and concentration.  Denies SI/HI or A/V hallucinations.  Wants to continue working on job issues and looking at other job options. A:  D/C today.  Will f/U with PCP (Dr. Cliffton AstersWhite) and Cleophas DunkerJoanie Tomar, LMFT on 04-30-14 @ 8 a.m..  Encouraged support groups.  R:  Pt receptive.

## 2014-03-28 ENCOUNTER — Other Ambulatory Visit (HOSPITAL_COMMUNITY): Payer: 59

## 2014-03-29 ENCOUNTER — Other Ambulatory Visit (HOSPITAL_COMMUNITY): Payer: 59

## 2014-04-01 ENCOUNTER — Other Ambulatory Visit (HOSPITAL_COMMUNITY): Payer: 59

## 2014-04-18 ENCOUNTER — Other Ambulatory Visit: Payer: Self-pay | Admitting: Orthopedic Surgery

## 2014-04-19 ENCOUNTER — Encounter (HOSPITAL_BASED_OUTPATIENT_CLINIC_OR_DEPARTMENT_OTHER): Payer: Self-pay | Admitting: *Deleted

## 2014-04-19 ENCOUNTER — Encounter (HOSPITAL_BASED_OUTPATIENT_CLINIC_OR_DEPARTMENT_OTHER)
Admission: RE | Admit: 2014-04-19 | Discharge: 2014-04-19 | Disposition: A | Discharge status: Home / Self Care | Payer: 59 | Source: Ambulatory Visit | Attending: Orthopedic Surgery | Admitting: Orthopedic Surgery

## 2014-04-19 DIAGNOSIS — Z01810 Encounter for preprocedural cardiovascular examination: Secondary | ICD-10-CM | POA: Insufficient documentation

## 2014-04-19 LAB — BASIC METABOLIC PANEL
BUN: 10 mg/dL (ref 6–23)
CO2: 25 mEq/L (ref 19–32)
Calcium: 9.7 mg/dL (ref 8.4–10.5)
Chloride: 106 mEq/L (ref 96–112)
Creatinine, Ser: 0.69 mg/dL (ref 0.50–1.10)
GFR calc Af Amer: >90 mL/min (ref >90)
Glucose, Bld: 91 mg/dL (ref 70–99)
Potassium: 4 mEq/L (ref 3.7–5.3)
Sodium: 143 mEq/L (ref 137–147)

## 2014-04-19 NOTE — Pre-Procedure Instructions (Signed)
To come for BMET and EKG 

## 2014-04-24 ENCOUNTER — Encounter (HOSPITAL_BASED_OUTPATIENT_CLINIC_OR_DEPARTMENT_OTHER): Payer: Self-pay | Admitting: Certified Registered"

## 2014-04-24 ENCOUNTER — Encounter (HOSPITAL_BASED_OUTPATIENT_CLINIC_OR_DEPARTMENT_OTHER): Payer: 59 | Admitting: Certified Registered"

## 2014-04-24 ENCOUNTER — Ambulatory Visit (HOSPITAL_BASED_OUTPATIENT_CLINIC_OR_DEPARTMENT_OTHER): Payer: 59 | Admitting: Certified Registered"

## 2014-04-24 ENCOUNTER — Ambulatory Visit (HOSPITAL_BASED_OUTPATIENT_CLINIC_OR_DEPARTMENT_OTHER)
Admission: RE | Admit: 2014-04-24 | Discharge: 2014-04-24 | Disposition: A | Discharge status: Home / Self Care | Payer: 59 | Source: Ambulatory Visit | Attending: Orthopedic Surgery | Admitting: Orthopedic Surgery

## 2014-04-24 ENCOUNTER — Encounter (HOSPITAL_BASED_OUTPATIENT_CLINIC_OR_DEPARTMENT_OTHER)
Admission: RE | Disposition: A | Discharge status: Home / Self Care | Payer: Self-pay | Source: Ambulatory Visit | Attending: Orthopedic Surgery

## 2014-04-24 DIAGNOSIS — F909 Attention-deficit hyperactivity disorder, unspecified type: Secondary | ICD-10-CM | POA: Insufficient documentation

## 2014-04-24 DIAGNOSIS — J45909 Unspecified asthma, uncomplicated: Secondary | ICD-10-CM | POA: Insufficient documentation

## 2014-04-24 DIAGNOSIS — Z79899 Other long term (current) drug therapy: Secondary | ICD-10-CM | POA: Insufficient documentation

## 2014-04-24 DIAGNOSIS — M24876 Other specific joint derangements of unspecified foot, not elsewhere classified: Principal | ICD-10-CM

## 2014-04-24 DIAGNOSIS — I10 Essential (primary) hypertension: Secondary | ICD-10-CM | POA: Insufficient documentation

## 2014-04-24 DIAGNOSIS — Z91013 Allergy to seafood: Secondary | ICD-10-CM | POA: Insufficient documentation

## 2014-04-24 DIAGNOSIS — F329 Major depressive disorder, single episode, unspecified: Secondary | ICD-10-CM | POA: Insufficient documentation

## 2014-04-24 DIAGNOSIS — Z882 Allergy status to sulfonamides status: Secondary | ICD-10-CM | POA: Insufficient documentation

## 2014-04-24 DIAGNOSIS — M24873 Other specific joint derangements of unspecified ankle, not elsewhere classified: Secondary | ICD-10-CM | POA: Insufficient documentation

## 2014-04-24 DIAGNOSIS — M25371 Other instability, right ankle: Secondary | ICD-10-CM

## 2014-04-24 DIAGNOSIS — F411 Generalized anxiety disorder: Secondary | ICD-10-CM | POA: Insufficient documentation

## 2014-04-24 DIAGNOSIS — F3289 Other specified depressive episodes: Secondary | ICD-10-CM | POA: Insufficient documentation

## 2014-04-24 HISTORY — DX: Other instability, right ankle: M25.371

## 2014-04-24 HISTORY — DX: Limited mandibular range of motion: M26.52

## 2014-04-24 HISTORY — DX: Essential (primary) hypertension: I10

## 2014-04-24 HISTORY — PX: ANKLE RECONSTRUCTION: SHX1151

## 2014-04-24 HISTORY — DX: Other seasonal allergic rhinitis: J30.2

## 2014-04-24 HISTORY — DX: Unspecified asthma, uncomplicated: J45.909

## 2014-04-24 SURGERY — RECONSTRUCTION, ANKLE
Anesthesia: General | Laterality: Right

## 2014-04-24 MED ORDER — CHLORHEXIDINE GLUCONATE 4 % EX LIQD: 60.0000 mL | Freq: Once | CUTANEOUS | Status: DC

## 2014-04-24 MED ORDER — CEFAZOLIN SODIUM-DEXTROSE 2-3 GM-% IV SOLR: 2.0000 g | INTRAVENOUS | Status: DC

## 2014-04-24 MED ORDER — HYDROMORPHONE HCL PF 1 MG/ML IJ SOLN: 0.2500 mg | INTRAMUSCULAR | Status: DC | PRN

## 2014-04-24 MED ORDER — FENTANYL CITRATE 0.05 MG/ML IJ SOLN
INTRAMUSCULAR | Status: AC
  Filled 2014-04-24: qty 2

## 2014-04-24 MED ORDER — MIDAZOLAM HCL 2 MG/2ML IJ SOLN
1.0000 mg | INTRAMUSCULAR | Status: DC | PRN
  Administered 2014-04-24: 2 mg via INTRAVENOUS

## 2014-04-24 MED ORDER — MIDAZOLAM HCL 2 MG/2ML IJ SOLN
INTRAMUSCULAR | Status: AC
  Filled 2014-04-24: qty 2

## 2014-04-24 MED ORDER — MIDAZOLAM HCL 2 MG/ML PO SYRP: 12.0000 mg | ORAL_SOLUTION | Freq: Once | ORAL | Status: DC | PRN

## 2014-04-24 MED ORDER — BUPIVACAINE HCL (PF) 0.25 % IJ SOLN
INTRAMUSCULAR | Status: DC | PRN
  Administered 2014-04-24: 20 mL via PERINEURAL

## 2014-04-24 MED ORDER — BUPIVACAINE HCL (PF) 0.25 % IJ SOLN
INTRAMUSCULAR | Status: DC | PRN
  Administered 2014-04-24: 10 mL

## 2014-04-24 MED ORDER — OXYCODONE HCL 5 MG/5ML PO SOLN: 5.0000 mg | Freq: Once | ORAL | Status: DC | PRN

## 2014-04-24 MED ORDER — LACTATED RINGERS IV SOLN
INTRAVENOUS | Status: DC
  Administered 2014-04-24 (×2): via INTRAVENOUS

## 2014-04-24 MED ORDER — OXYCODONE-ACETAMINOPHEN 5-325 MG PO TABS: 1.0000 | ORAL_TABLET | Freq: Four times a day (QID) | ORAL | Status: DC | PRN

## 2014-04-24 MED ORDER — DEXAMETHASONE SODIUM PHOSPHATE 10 MG/ML IJ SOLN
INTRAMUSCULAR | Status: DC | PRN
  Administered 2014-04-24: 10 mg via INTRAVENOUS

## 2014-04-24 MED ORDER — ONDANSETRON HCL 4 MG/2ML IJ SOLN
INTRAMUSCULAR | Status: DC | PRN
  Administered 2014-04-24: 4 mg via INTRAVENOUS

## 2014-04-24 MED ORDER — FENTANYL CITRATE 0.05 MG/ML IJ SOLN
50.0000 ug | INTRAMUSCULAR | Status: DC | PRN
  Administered 2014-04-24: 100 ug via INTRAVENOUS

## 2014-04-24 MED ORDER — OXYCODONE HCL 5 MG PO TABS: 5.0000 mg | ORAL_TABLET | Freq: Once | ORAL | Status: DC | PRN

## 2014-04-24 MED ORDER — FENTANYL CITRATE 0.05 MG/ML IJ SOLN
INTRAMUSCULAR | Status: DC | PRN
  Administered 2014-04-24 (×2): 50 ug via INTRAVENOUS

## 2014-04-24 MED ORDER — PROPOFOL 10 MG/ML IV EMUL
INTRAVENOUS | Status: AC
  Filled 2014-04-24: qty 50

## 2014-04-24 MED ORDER — LIDOCAINE HCL (CARDIAC) 20 MG/ML IV SOLN
INTRAVENOUS | Status: DC | PRN
  Administered 2014-04-24: 50 mg via INTRAVENOUS

## 2014-04-24 MED ORDER — CEFAZOLIN SODIUM-DEXTROSE 2-3 GM-% IV SOLR
INTRAVENOUS | Status: AC
  Filled 2014-04-24: qty 50

## 2014-04-24 MED ORDER — MIDAZOLAM HCL 5 MG/5ML IJ SOLN
INTRAMUSCULAR | Status: DC | PRN
  Administered 2014-04-24: 2 mg via INTRAVENOUS

## 2014-04-24 MED ORDER — FENTANYL CITRATE 0.05 MG/ML IJ SOLN
INTRAMUSCULAR | Status: AC
  Filled 2014-04-24: qty 4

## 2014-04-24 MED ORDER — BUPIVACAINE-EPINEPHRINE (PF) 0.5% -1:200000 IJ SOLN
INTRAMUSCULAR | Status: DC | PRN
  Administered 2014-04-24: 20 mL

## 2014-04-24 MED ORDER — FENTANYL CITRATE 0.05 MG/ML IJ SOLN
INTRAMUSCULAR | Status: AC
  Filled 2014-04-24: qty 6

## 2014-04-24 MED ORDER — METOCLOPRAMIDE HCL 5 MG/ML IJ SOLN: 10.0000 mg | Freq: Once | INTRAMUSCULAR | Status: DC | PRN

## 2014-04-24 MED ORDER — PROPOFOL 10 MG/ML IV BOLUS
INTRAVENOUS | Status: DC | PRN
  Administered 2014-04-24: 200 mg via INTRAVENOUS

## 2014-04-24 MED ORDER — METOCLOPRAMIDE HCL 5 MG/ML IJ SOLN
INTRAMUSCULAR | Status: DC | PRN
  Administered 2014-04-24 (×2): 5 mg via INTRAVENOUS

## 2014-04-24 MED ORDER — CEFAZOLIN SODIUM-DEXTROSE 2-3 GM-% IV SOLR
INTRAVENOUS | Status: DC | PRN
  Administered 2014-04-24: 2 g via INTRAVENOUS

## 2014-04-24 SURGICAL SUPPLY — 74 items
BANDAGE ELASTIC 3 VELCRO ST LF (GAUZE/BANDAGES/DRESSINGS) IMPLANT
BANDAGE ELASTIC 4 VELCRO ST LF (GAUZE/BANDAGES/DRESSINGS) ×6 IMPLANT
BANDAGE ESMARK 6X9 LF (GAUZE/BANDAGES/DRESSINGS) ×1 IMPLANT
BENZOIN TINCTURE PRP APPL 2/3 (GAUZE/BANDAGES/DRESSINGS) ×3 IMPLANT
BLADE 15 SAFETY STRL DISP (BLADE) ×3 IMPLANT
BNDG ESMARK 4X9 LF (GAUZE/BANDAGES/DRESSINGS) IMPLANT
BNDG ESMARK 6X9 LF (GAUZE/BANDAGES/DRESSINGS) ×3
CANISTER SUCT 1200ML W/VALVE (MISCELLANEOUS) ×3 IMPLANT
CLOSURE WOUND 1/2 X4 (GAUZE/BANDAGES/DRESSINGS) ×1
COVER TABLE BACK 60X90 (DRAPES) ×3 IMPLANT
DECANTER SPIKE VIAL GLASS SM (MISCELLANEOUS) IMPLANT
DRAIN PENROSE 1/4X12 LTX STRL (WOUND CARE) IMPLANT
DRAPE EXTREMITY T 121X128X90 (DRAPE) ×3 IMPLANT
DRAPE OEC MINIVIEW 54X84 (DRAPES) IMPLANT
DRAPE U 20/CS (DRAPES) IMPLANT
DRAPE U-SHAPE 47X51 STRL (DRAPES) ×3 IMPLANT
DRSG EMULSION OIL 3X3 NADH (GAUZE/BANDAGES/DRESSINGS) IMPLANT
DURAPREP 26ML APPLICATOR (WOUND CARE) ×3 IMPLANT
ELECT NEEDLE TIP 2.8 STRL (NEEDLE) IMPLANT
ELECT REM PT RETURN 9FT ADLT (ELECTROSURGICAL) ×3
ELECTRODE REM PT RTRN 9FT ADLT (ELECTROSURGICAL) ×1 IMPLANT
GAUZE SPONGE 4X4 12PLY STRL (GAUZE/BANDAGES/DRESSINGS) ×3 IMPLANT
GAUZE XEROFORM 1X8 LF (GAUZE/BANDAGES/DRESSINGS) IMPLANT
GLOVE BIOGEL PI IND STRL 7.0 (GLOVE) ×1 IMPLANT
GLOVE BIOGEL PI IND STRL 8 (GLOVE) ×2 IMPLANT
GLOVE BIOGEL PI INDICATOR 7.0 (GLOVE) ×2
GLOVE BIOGEL PI INDICATOR 8 (GLOVE) ×4
GLOVE ECLIPSE 6.5 STRL STRAW (GLOVE) ×3 IMPLANT
GLOVE ECLIPSE 7.5 STRL STRAW (GLOVE) ×6 IMPLANT
GLOVE EXAM NITRILE LRG STRL (GLOVE) ×3 IMPLANT
GOWN STRL REUS W/TWL XL LVL3 (GOWN DISPOSABLE) ×3 IMPLANT
KIT MINI BIO ANCHOR DRILL (KITS) IMPLANT
NEEDLE 1/2 CIR CATGUT .05X1.09 (NEEDLE) IMPLANT
NEEDLE ADDISON D1/2 CIR (NEEDLE) IMPLANT
NEEDLE HYPO 25X1 1.5 SAFETY (NEEDLE) ×3 IMPLANT
NS IRRIG 1000ML POUR BTL (IV SOLUTION) ×3 IMPLANT
PACK BASIN DAY SURGERY FS (CUSTOM PROCEDURE TRAY) ×3 IMPLANT
PAD CAST 3X4 CTTN HI CHSV (CAST SUPPLIES) ×1 IMPLANT
PAD CAST 4YDX4 CTTN HI CHSV (CAST SUPPLIES) ×3 IMPLANT
PADDING CAST ABS 4INX4YD NS (CAST SUPPLIES) ×6
PADDING CAST ABS COTTON 4X4 ST (CAST SUPPLIES) ×3 IMPLANT
PADDING CAST COTTON 3X4 STRL (CAST SUPPLIES) ×2
PADDING CAST COTTON 4X4 STRL (CAST SUPPLIES) ×6
PENCIL BUTTON HOLSTER BLD 10FT (ELECTRODE) ×3 IMPLANT
SHEET MEDIUM DRAPE 40X70 STRL (DRAPES) IMPLANT
SPLINT FAST PLASTER 5X30 (CAST SUPPLIES)
SPLINT PLASTER CAST FAST 5X30 (CAST SUPPLIES) IMPLANT
STOCKINETTE 6  STRL (DRAPES) ×2
STOCKINETTE 6 STRL (DRAPES) ×1 IMPLANT
STRIP CLOSURE SKIN 1/2X4 (GAUZE/BANDAGES/DRESSINGS) ×2 IMPLANT
SUCTION FRAZIER TIP 10 FR DISP (SUCTIONS) IMPLANT
SUT ETHIBOND 0 MO6 C/R (SUTURE) IMPLANT
SUT ETHIBOND 3-0 V-5 (SUTURE) ×3 IMPLANT
SUT ETHILON 3 0 PS 1 (SUTURE) IMPLANT
SUT ETHILON 4 0 PS 2 18 (SUTURE) IMPLANT
SUT FIBERWIRE 3-0 18 TAPR NDL (SUTURE) ×9
SUT MNCRL AB 3-0 PS2 18 (SUTURE) IMPLANT
SUT MNCRL AB 4-0 PS2 18 (SUTURE) ×3 IMPLANT
SUT VIC AB 0 CT3 27 (SUTURE) IMPLANT
SUT VIC AB 0 SH 27 (SUTURE) IMPLANT
SUT VIC AB 2-0 PS2 27 (SUTURE) IMPLANT
SUT VIC AB 2-0 SH 27 (SUTURE)
SUT VIC AB 2-0 SH 27XBRD (SUTURE) IMPLANT
SUT VIC AB 3-0 FS2 27 (SUTURE) IMPLANT
SUT VIC AB 4-0 P-3 18XBRD (SUTURE) IMPLANT
SUT VIC AB 4-0 P3 18 (SUTURE)
SUT VICRYL 4-0 PS2 18IN ABS (SUTURE) ×3 IMPLANT
SUTURE FIBERWR 3-0 18 TAPR NDL (SUTURE) ×3 IMPLANT
SYR BULB 3OZ (MISCELLANEOUS) ×3 IMPLANT
SYR CONTROL 10ML LL (SYRINGE) ×3 IMPLANT
TOWEL OR 17X24 6PK STRL BLUE (TOWEL DISPOSABLE) ×3 IMPLANT
TUBE CONNECTING 20'X1/4 (TUBING) ×1
TUBE CONNECTING 20X1/4 (TUBING) ×2 IMPLANT
UNDERPAD 30X30 INCONTINENT (UNDERPADS AND DIAPERS) ×3 IMPLANT

## 2014-04-24 NOTE — Progress Notes (Signed)
chl nAssisted Dr. Gelene Mink with right, ultrasound guided, popliteal/saphenous block. Side rails up, monitors on throughout procedure. See vital signs in flow sheet. Tolerated Procedure well.

## 2014-04-24 NOTE — H&P (Signed)
PREOPERATIVE H&P  Chief Complaint: r ankle instability  HPI: Joy Walters is a [age] y.o. female who presents for evaluation of r ankle instability. It has been present for years and has been worsening. She has failed conservative measures. Pain is rated as moderate.  Past Medical History  Diagnosis Date  . Anxiety   . Depression   . ADHD (attention deficit hyperactivity disorder)   . Asthma     prn inhaler  . 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 Hypertension     under control with med., has been on med. < 1 yr.  . Seasonal allergies   . 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 Cholecystectomy  07/25/2009  . Maxillary le forte i osteotomy  05/07/2002  . Mandibular sagittal split osteotomy w/ internal fixation Bilateral 05/07/2002  . Lasik     History   Social History  . Marital Status: Single    Spouse Name: N/A    Number of Children: N/A  . Years of Education: N/A   Social History Main Topics  . Smoking status: Never Smoker   . Smokeless tobacco: Never Used  . Alcohol Use: No  . Drug Use: No  . Sexual Activity: No   Other Topics Concern  . None   Social History Narrative  . None   Family History  Problem Relation Age of Onset  . Depression Mother   . Depression Father   . OCD Father    Allergies  Allergen Reactions  . Scallops [Shellfish Allergy] Shortness Of Breath  . Sulfa Antibiotics Shortness Of Breath   Prior to Admission medications   Medication Sig Start Date End Date Taking? Authorizing Provider  albuterol (PROVENTIL HFA;VENTOLIN HFA) 108 (90 BASE) MCG/ACT inhaler Inhale into the lungs every 6 (six) hours as needed for wheezing or shortness of breath.   Yes Historical Provider, MD  atomoxetine (STRATTERA) 100 MG capsule Take 100 mg by mouth daily.   Yes Historical Provider, MD  DULoxetine (CYMBALTA) 60 MG capsule Take 60 mg by mouth daily.   Yes Historical Provider, MD  ibuprofen  (ADVIL,MOTRIN) 200 MG tablet Take 200 mg by mouth every 6 (six) hours as needed.   Yes Historical Provider, MD  loratadine (CLARITIN) 10 MG tablet Take 10 mg by mouth daily.   Yes Historical Provider, MD  losartan (COZAAR) 50 MG tablet Take 50 mg by mouth daily.   Yes Historical Provider, MD  methylphenidate (CONCERTA) 27 MG CR tablet Take 1 tablet (27 mg total) by mouth daily. 03/11/14 03/11/15 Yes Gayland CurryGayathri D Tadepalli, MD  montelukast (SINGULAIR) 10 MG tablet Take 10 mg by mouth at bedtime.   Yes Historical Provider, MD  Multiple Vitamin (MULTIVITAMIN) tablet Take 1 tablet by mouth daily.   Yes Historical Provider, MD     Positive ROS: none  All other systems have been reviewed and were otherwise negative with the exception of those mentioned in the HPI and as above.  Physical Exam: Filed Vitals:   04/24/14 1215  BP:   Pulse: 79  Temp:   Resp:     General: Alert, no acute distress Cardiovascular: No pedal edema Respiratory: No cyanosis, no use of accessory musculature GI: No organomegaly, abdomen is soft and non-tender Skin: No lesions in the area of chief complaint Neurologic: Sensation intact distally Psychiatric: Patient is competent for consent with normal mood and affect Lymphatic: No axillary or cervical lymphadenopathy  MUSCULOSKELETAL: r ankle grossly unstable with + ant drawer and lat opening  Stress xray opens 18 deg  Assessment/Plan: RIGHT ANKLE INSTABILITY Plan for Procedure(s): RECONSTRUCTION ANKLE  The risks benefits and alternatives were discussed with the patient including but not limited to the risks of nonoperative treatment, versus surgical intervention including infection, bleeding, nerve injury, malunion, nonunion, hardware prominence, hardware failure, need for hardware removal, blood clots, cardiopulmonary complications, morbidity, mortality, among others, and they were willing to proceed.  Predicted outcome is good, although there will be at least a six  to nine month expected recovery.  Harvie Junior, MD 04/24/2014 1:16 PM

## 2014-04-24 NOTE — Brief Op Note (Signed)
04/24/2014  2:46 PM  PATIENT:  Forde Dandy  33 y.o. female  PRE-OPERATIVE DIAGNOSIS:  RIGHT ANKLE INSTABILITY  POST-OPERATIVE DIAGNOSIS:  RIGHT ANKLE INSTABILITY  PROCEDURE:  Procedure(s): RECONSTRUCTION ANKLE (Right)  SURGEON:  Surgeon(s) and Role:    * Harvie Junior, MD - Primary  PHYSICIAN ASSISTANT:   ASSISTANTS: bethune   ANESTHESIA:   general  EBL:  Total I/O In: 1300 [I.V.:1300] Out: -   BLOOD ADMINISTERED:none  DRAINS: none   LOCAL MEDICATIONS USED:  NONE  SPECIMEN:  No Specimen  DISPOSITION OF SPECIMEN:  N/A  COUNTS:  YES  TOURNIQUET:  * Missing tourniquet times found for documented tourniquets in log:  [id number] *  DICTATION: .Other Dictation: Dictation Number [id number]  PLAN OF CARE: Discharge to home after PACU  PATIENT DISPOSITION:  PACU - hemodynamically stable.   Delay start of Pharmacological VTE agent (>24hrs) due to surgical blood loss or risk of bleeding: no

## 2014-04-24 NOTE — Transfer of Care (Signed)
Immediate Anesthesia Transfer of Care Note  Patient: Joy Walters  Procedure(s) Performed: Procedure(s): RECONSTRUCTION ANKLE (Right)  Patient Location: PACU  Anesthesia Type:General  Level of Consciousness: awake, sedated and patient cooperative  Airway & Oxygen Therapy: Patient Spontanous Breathing and Patient connected to face mask oxygen  Post-op Assessment: Report given to PACU RN and Post -op Vital signs reviewed and stable  Post vital signs: Reviewed and stable  Complications: No apparent anesthesia complications

## 2014-04-24 NOTE — Anesthesia Postprocedure Evaluation (Signed)
Anesthesia Post Note  Patient: Joy Walters  Procedure(s) Performed: Procedure(s) (LRB): RECONSTRUCTION ANKLE (Right)  Anesthesia type: General  Patient location: PACU  Post pain: Pain level controlled  Post assessment: Patient's Cardiovascular Status Stable  Last Vitals:  Filed Vitals:   04/24/14 1545  BP: 112/52  Pulse: 95  Temp:   Resp: 15    Post vital signs: Reviewed and stable  Level of consciousness: alert  Complications: No apparent anesthesia complications

## 2014-04-24 NOTE — Discharge Instructions (Signed)
Ambulate NON WEIGHT BEARING on the right leg. Take one enteric coated Aspirin 325mg  daily with food to decrease risk of blood clots in your leg. Do this x 3 weeks post op.    Post Anesthesia Home Care Instructions  Activity: Get plenty of rest for the remainder of the day. A responsible adult should stay with you for 24 hours following the procedure.  For the next 24 hours, DO NOT: -Drive a car -Advertising copywriter -Drink alcoholic beverages -Take any medication unless instructed by your physician -Make any legal decisions or sign important papers.  Meals: Start with liquid foods such as gelatin or soup. Progress to regular foods as tolerated. Avoid greasy, spicy, heavy foods. If nausea and/or vomiting occur, drink only clear liquids until the nausea and/or vomiting subsides. Call your physician if vomiting continues.  Special Instructions/Symptoms: Your throat may feel dry or sore from the anesthesia or the breathing tube placed in your throat during surgery. If this causes discomfort, gargle with warm salt water. The discomfort should disappear within 24 hours.   Call your surgeon if you experience:   1.  Fever over 101.0. 2.  Inability to urinate. 3.  Nausea and/or vomiting. 4.  Extreme swelling or bruising at the surgical site. 5.  Continued bleeding from the incision. 6.  Increased pain, redness or drainage from the incision. 7.  Problems related to your pain medication.  Regional Anesthesia Blocks  1. Numbness or the inability to move the "blocked" extremity may last from 3-48 hours after placement. The length of time depends on the medication injected and your individual response to the medication. If the numbness is not going away after 48 hours, call your surgeon.  2. The extremity that is blocked will need to be protected until the numbness is gone and the  Strength has returned. Because you cannot feel it, you will need to take extra care to avoid injury. Because it may  be weak, you may have difficulty moving it or using it. You may not know what position it is in without looking at it while the block is in effect.  3. For blocks in the legs and feet, returning to weight bearing and walking needs to be done carefully. You will need to wait until the numbness is entirely gone and the strength has returned. You should be able to move your leg and foot normally before you try and bear weight or walk. You will need someone to be with you when you first try to ensure you do not fall and possibly risk injury.  4. Bruising and tenderness at the needle site are common side effects and will resolve in a few days.  5. Persistent numbness or new problems with movement should be communicated to the surgeon or the [facility] [phone] [facility] [phone]).

## 2014-04-24 NOTE — Anesthesia Preprocedure Evaluation (Signed)
Anesthesia Evaluation  Patient identified by MRN, date of birth, ID band Patient awake    Reviewed: Allergy & Precautions, H&P , NPO status , Patient's Chart, lab work & pertinent test results, reviewed documented beta blocker date and time   Airway Mallampati: II TM Distance: >3 FB Neck ROM: full    Dental   Pulmonary asthma ,  breath sounds clear to auscultation        Cardiovascular hypertension, On Medications Rhythm:regular     Neuro/Psych PSYCHIATRIC DISORDERS negative neurological ROS     GI/Hepatic negative GI ROS, Neg liver ROS,   Endo/Other  negative endocrine ROS  Renal/GU negative Renal ROS  negative genitourinary   Musculoskeletal   Abdominal   Peds  Hematology negative hematology ROS (+)   Anesthesia Other Findings See surgeon's H&P   Reproductive/Obstetrics negative OB ROS                           Anesthesia Physical Anesthesia Plan  ASA: III  Anesthesia Plan: General   Post-op Pain Management:    Induction: Intravenous  Airway Management Planned: LMA  Additional Equipment:   Intra-op Plan:   Post-operative Plan:   Informed Consent: I have reviewed the patients History and Physical, chart, labs and discussed the procedure including the risks, benefits and alternatives for the proposed anesthesia with the patient or authorized representative who has indicated his/her understanding and acceptance.   Dental Advisory Given  Plan Discussed with: CRNA and Surgeon  Anesthesia Plan Comments:         Anesthesia Quick Evaluation

## 2014-04-24 NOTE — Anesthesia Procedure Notes (Addendum)
Anesthesia Regional Block:  Popliteal block  Pre-Anesthetic Checklist: ,, timeout performed, Correct Patient, Correct Site, Correct Laterality, Correct Procedure, Correct Position, site marked, Risks and benefits discussed,  Surgical consent,  Pre-op evaluation,  At surgeon's request and post-op pain management  Laterality: Right  Prep: chloraprep       Needles:   Needle Type: Other     Needle Length: 9cm 9 cm Needle Gauge: 21 and 21 G    Additional Needles:  Procedures: ultrasound guided (picture in chart) Popliteal block Narrative:  Start time: 04/24/2014 11:40 AM End time: 04/24/2014 11:50 AM Injection made incrementally with aspirations every 5 mL.  Performed by: Personally  Anesthesiologist: Aldona Lento, MD  Additional Notes: Ultrasound guidance used to: id relevant anatomy, confirm needle position, local anesthetic spread, avoidance of vascular puncture. Picture saved. No complications. Block performed personally by Janetta Hora. Gelene Mink, MD  .     Procedure Name: LMA Insertion Date/Time: 04/24/2014 1:48 PM Performed by: [facility] Pre-anesthesia Checklist: Patient identified, Emergency Drugs available, Suction available, Patient being monitored and Timeout performed Patient Re-evaluated:Patient Re-evaluated prior to inductionOxygen Delivery Method: Circle System Utilized Preoxygenation: Pre-oxygenation with 100% oxygen Intubation Type: IV induction Ventilation: Mask ventilation without difficulty LMA: LMA inserted LMA Size: 4.0 Number of attempts: 1 Airway Equipment and Method: bite block Placement Confirmation: positive ETCO2 and breath sounds checked- equal and bilateral Tube secured with: Tape Dental Injury: Teeth and Oropharynx as per pre-operative assessment

## 2014-04-25 ENCOUNTER — Encounter (HOSPITAL_BASED_OUTPATIENT_CLINIC_OR_DEPARTMENT_OTHER): Payer: Self-pay | Admitting: Orthopedic Surgery

## 2014-04-25 LAB — POCT HEMOGLOBIN-HEMACUE: HEMOGLOBIN: 10.1 g/dL — AB (ref 12.0–15.0)

## 2014-04-25 NOTE — Op Note (Signed)
NAMEGHADEER, SAULTER         ACCOUNT NO.:  [id number]  MEDICAL RECORD NO.:  [id number]  LOCATION:                                 FACILITY:  PHYSICIAN:  Harvie Junior, M.D.   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  PROCEDURE:  Right ankle ligament reconstruction.  SURGEON:  Harvie Junior, MD  ASSISTANT:  Marshia Ly, PA  ANESTHESIA:  General.  BRIEF HISTORY:  Mrs. Rudel is a 33 year old female with history of having significant complaints of right ankle instability.  She twisted her ankle about multiple times, had one, severe when as a child and then had multiple episodes of giving out.  DESCRIPTION OF PROCEDURE:  After adequate anesthesia was obtained with general anesthetic, the patient was placed supine on operating table. The right leg was then prepped and draped in usual sterile fashion. Following this, the leg was exsanguinated.  Blood pressure tourniquet inflated to 250 mmHg.  Following this, a curved incision was made in the anterior aspect of the fibula, subcutaneous tissue, then to the level of the anterior lateral ligamentous structure and the inferior extensor retinaculum was identified and tagged.  Once this was done, attention was turned to the anterior ligament structure which was divided and the anterior talofibular ligament and calcaneofibular ligament were identified in this position.  We then did a pants-over-vest suture repair of the calcaneofibular ligament and anterior talofibular ligament, did 3 additional FiberWire pants-over-vest horizontal mattress suture repairs.  We then did a running FiberWire in between the stitches to give it very nice  pants-over-vest repair.  While we were tying the stitches, the foot was held in dorsiflexion in everted position.  Once this was done, the inferior extensor retinaculum was advanced over the repair and sutured to the periosteum on the fibula.  A very nice repair was achieved at this point.  At this point, the wound was irrigated, suctioned dry, closed with 3-0 Vicryl and 3-0 Monocryl subcuticular.  Benzoin and Steri-Strips applied. Sterile compressive dressing was applied, and the patient placed into a U and a posterior splint with the foot held in eversion and dorsiflexion.  At this point, she was taken to recovery room in satisfactory condition.  Estimated blood loss for the procedure was none.     Harvie Junior, M.D.     Ranae Plumber  D:  04/24/2014  T:  04/25/2014  Job:  [id number]

## 2014-04-30 ENCOUNTER — Ambulatory Visit (HOSPITAL_COMMUNITY): Payer: Self-pay | Admitting: Marriage and Family Therapist

## 2014-07-20 ENCOUNTER — Emergency Department (HOSPITAL_COMMUNITY)
Admission: EM | Admit: 2014-07-20 | Discharge: 2014-07-21 | Disposition: A | Discharge status: Home / Self Care | Payer: Worker's Compensation | Attending: Emergency Medicine | Admitting: Emergency Medicine

## 2014-07-20 ENCOUNTER — Encounter (HOSPITAL_COMMUNITY): Payer: Self-pay | Admitting: Emergency Medicine

## 2014-07-20 DIAGNOSIS — F3289 Other specified depressive episodes: Secondary | ICD-10-CM | POA: Insufficient documentation

## 2014-07-20 DIAGNOSIS — W268XXA Contact with other sharp object(s), not elsewhere classified, initial encounter: Secondary | ICD-10-CM | POA: Insufficient documentation

## 2014-07-20 DIAGNOSIS — Z8739 Personal history of other diseases of the musculoskeletal system and connective tissue: Secondary | ICD-10-CM | POA: Diagnosis not present

## 2014-07-20 DIAGNOSIS — Z79899 Other long term (current) drug therapy: Secondary | ICD-10-CM | POA: Insufficient documentation

## 2014-07-20 DIAGNOSIS — S61209A Unspecified open wound of unspecified finger without damage to nail, initial encounter: Secondary | ICD-10-CM | POA: Diagnosis not present

## 2014-07-20 DIAGNOSIS — Y990 Civilian activity done for income or pay: Secondary | ICD-10-CM | POA: Diagnosis not present

## 2014-07-20 DIAGNOSIS — Y9389 Activity, other specified: Secondary | ICD-10-CM | POA: Diagnosis not present

## 2014-07-20 DIAGNOSIS — I10 Essential (primary) hypertension: Secondary | ICD-10-CM | POA: Diagnosis not present

## 2014-07-20 DIAGNOSIS — Z8719 Personal history of other diseases of the digestive system: Secondary | ICD-10-CM | POA: Diagnosis not present

## 2014-07-20 DIAGNOSIS — F909 Attention-deficit hyperactivity disorder, unspecified type: Secondary | ICD-10-CM | POA: Diagnosis not present

## 2014-07-20 DIAGNOSIS — F411 Generalized anxiety disorder: Secondary | ICD-10-CM | POA: Diagnosis not present

## 2014-07-20 DIAGNOSIS — J45909 Unspecified asthma, uncomplicated: Secondary | ICD-10-CM | POA: Diagnosis not present

## 2014-07-20 DIAGNOSIS — S61218A Laceration without foreign body of other finger without damage to nail, initial encounter: Secondary | ICD-10-CM

## 2014-07-20 DIAGNOSIS — Y9289 Other specified places as the place of occurrence of the external cause: Secondary | ICD-10-CM | POA: Diagnosis not present

## 2014-07-20 DIAGNOSIS — F329 Major depressive disorder, single episode, unspecified: Secondary | ICD-10-CM | POA: Diagnosis not present

## 2014-07-20 NOTE — ED Notes (Signed)
Spoke to staff at [facility], this is a workman's comp injury, this patient does not require drug screening for this injury. Medical Awareness Card given to registration for billing information.

## 2014-07-20 NOTE — ED Notes (Signed)
Pt arrived to the Ed with the complaint of finger lacerations. Pt was picking up a new piece of equipment which had sharp sides and the pt cut her left index and middle fingers.  Laceration on index finger is 1 cm and one on middle finger is 0.5 cm.  Bleeding has stopped.

## 2014-07-21 MED ORDER — LIDOCAINE HCL 2 % IJ SOLN
20.0000 mL | Freq: Once | INTRAMUSCULAR | Status: DC
  Filled 2014-07-21: qty 20

## 2014-07-21 NOTE — ED Provider Notes (Signed)
Medical screening examination/treatment/procedure(s) were performed by non-physician practitioner and as supervising physician I was immediately available for consultation/collaboration.   EKG Interpretation None        Hanley Seamen, MD 07/21/14 [phone]

## 2014-07-21 NOTE — ED Provider Notes (Signed)
CSN: [id number]     Arrival date & time 07/20/14  2318 History   First MD Initiated Contact with Patient 07/21/14 0031     Chief Complaint  Patient presents with  . Extremity Laceration   HPI  History provided by the patient. The patient is a 33 year old female with presentation of laceration to the left index finger. Patient cut her finger on a machine at work. Reports the blade was very sharp. She has a laceration to the very tip of her finger. There was bleeding which has been controlled. No other treatments used. Denies any other complaints. Patient reports having a tetanus within the last 4-5 years.    Past Medical History  Diagnosis Date  . Anxiety   . Depression   . ADHD (attention deficit hyperactivity disorder)   . Asthma     prn inhaler  . 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 Hypertension     under control with med., has been on med. < 1 yr.  . Seasonal allergies   . 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 Cholecystectomy  07/25/2009  . Maxillary le forte i osteotomy  05/07/2002  . Mandibular sagittal split osteotomy w/ internal fixation Bilateral 05/07/2002  . Lasik    . Ankle reconstruction Right 04/24/2014    Procedure: RECONSTRUCTION ANKLE;  Surgeon: Harvie Junior, MD;  Location: [facility];  Service: Orthopedics;  Laterality: Right;   Family History  Problem Relation Age of Onset  . Depression Mother   . Depression Father   . OCD Father    History  Substance Use Topics  . Smoking status: Never Smoker   . Smokeless tobacco: Never Used  . Alcohol Use: No   OB History   Grav Para Term Preterm Abortions TAB SAB Ect Mult Living                 Review of Systems  All other systems reviewed and are negative.     Allergies  Scallops and Sulfa antibiotics  Home Medications   Prior to Admission medications   Medication Sig Start Date End Date Taking? Authorizing Provider   albuterol (PROVENTIL HFA;VENTOLIN HFA) 108 (90 BASE) MCG/ACT inhaler Inhale into the lungs every 6 (six) hours as needed for wheezing or shortness of breath.   Yes Historical Provider, MD  atomoxetine (STRATTERA) 100 MG capsule Take 100 mg by mouth daily.   Yes Historical Provider, MD  DULoxetine (CYMBALTA) 60 MG capsule Take 60 mg by mouth daily.   Yes Historical Provider, MD  ibuprofen (ADVIL,MOTRIN) 200 MG tablet Take 200 mg by mouth every 6 (six) hours as needed for mild pain.    Yes Historical Provider, MD  loratadine (CLARITIN) 10 MG tablet Take 10 mg by mouth daily.   Yes Historical Provider, MD  losartan (COZAAR) 50 MG tablet Take 50 mg by mouth daily.   Yes Historical Provider, MD  methylphenidate (CONCERTA) 27 MG CR tablet Take 1 tablet (27 mg total) by mouth daily. 03/11/14 03/11/15 Yes Gayland Curry, MD  montelukast (SINGULAIR) 10 MG tablet Take 10 mg by mouth at bedtime.   Yes Historical Provider, MD  Multiple Vitamin (MULTIVITAMIN) tablet Take 1 tablet by mouth daily.   Yes Historical Provider, MD   BP 137/77  Pulse 104  Temp(Src) 98.8 F (37.1 C) (Oral)  Resp 16  SpO2 99%  LMP 07/20/2014 Physical Exam  Nursing  note and vitals reviewed. Constitutional: She is oriented to person, place, and time. She appears well-developed and well-nourished. No distress.  HENT:  Head: Normocephalic.  Cardiovascular: Normal rate and regular rhythm.   Pulmonary/Chest: Effort normal and breath sounds normal.  Musculoskeletal:  Laceration to the tip of the left index finger. There is a superficial laceration to the small part of the left middle fingertip. Bleeding controlled. Normal capillary refill.  Neurological: She is alert and oriented to person, place, and time.  Skin: Skin is warm and dry. No rash noted.  Psychiatric: She has a normal mood and affect. Her behavior is normal.    ED Course  Procedures   COORDINATION OF CARE:  Nursing notes reviewed. Vital signs reviewed.  Initial pt interview and examination performed.   Filed Vitals:   07/20/14 2330  BP: 137/77  Pulse: 104  Temp: 98.8 F (37.1 C)  TempSrc: Oral  Resp: 16  SpO2: 99%    1:12 AM-Discussed work up plan with pt at bedside, which includes No orders of the defined types were placed in this encounter.  . Pt agrees with plan.   Treatment plan initiated: Medications  lidocaine (XYLOCAINE) 2 % (with pres) injection 400 mg (not administered)     LACERATION REPAIR Performed by: Angus Seller Authorized by: Angus Seller Consent: Verbal consent obtained. Risks and benefits: risks, benefits and alternatives were discussed Consent given by: patient Patient identity confirmed: provided demographic data Prepped and Draped in normal sterile fashion Wound explored  Laceration Location: Left index finger  Laceration Length: 1cm  No Foreign Bodies seen or palpated  Anesthesia: local infiltration  Local anesthetic: lidocaine 2% without epinephrine  Anesthetic total: 1 ml  Irrigation method: syringe Amount of cleaning: standard  Skin closure: Skin with 4-0 Prolene   Number of sutures: 2   Technique: Simple interrupted   Patient tolerance: Patient tolerated the procedure well with no immediate complications.      MDM   Final diagnoses:  Laceration of index finger, initial encounter        Angus Seller, PA-C 07/21/14 0133

## 2014-07-21 NOTE — Discharge Instructions (Signed)
Keep your wound clean and dry. Followup with your doctor in 7 days to have your sutures removed. Return any time if you have worsening symptoms or concerns of infection.    Laceration Care, Adult A laceration is a cut or lesion that goes through all layers of the skin and into the tissue just beneath the skin. TREATMENT  Some lacerations may not require closure. Some lacerations may not be able to be closed due to an increased risk of infection. It is important to see your caregiver as soon as possible after an injury to minimize the risk of infection and maximize the opportunity for successful closure. If closure is appropriate, pain medicines may be given, if needed. The wound will be cleaned to help prevent infection. Your caregiver will use stitches (sutures), staples, wound glue (adhesive), or skin adhesive strips to repair the laceration. These tools bring the skin edges together to allow for faster healing and a better cosmetic outcome. However, all wounds will heal with a scar. Once the wound has healed, scarring can be minimized by covering the wound with sunscreen during the day for 1 full year. HOME CARE INSTRUCTIONS  For sutures or staples:  Keep the wound clean and dry.  If you were given a bandage (dressing), you should change it at least once a day. Also, change the dressing if it becomes wet or dirty, or as directed by your caregiver.  Wash the wound with soap and water 2 times a day. Rinse the wound off with water to remove all soap. Pat the wound dry with a clean towel.  After cleaning, apply a thin layer of the antibiotic ointment as recommended by your caregiver. This will help prevent infection and keep the dressing from sticking.  You may shower as usual after the first 24 hours. Do not soak the wound in water until the sutures are removed.  Only take over-the-counter or prescription medicines for pain, discomfort, or fever as directed by your caregiver.  Get your  sutures or staples removed as directed by your caregiver. For skin adhesive strips:  Keep the wound clean and dry.  Do not get the skin adhesive strips wet. You may bathe carefully, using caution to keep the wound dry.  If the wound gets wet, pat it dry with a clean towel.  Skin adhesive strips will fall off on their own. You may trim the strips as the wound heals. Do not remove skin adhesive strips that are still stuck to the wound. They will fall off in time. For wound adhesive:  You may briefly wet your wound in the shower or bath. Do not soak or scrub the wound. Do not swim. Avoid periods of heavy perspiration until the skin adhesive has fallen off on its own. After showering or bathing, gently pat the wound dry with a clean towel.  Do not apply liquid medicine, cream medicine, or ointment medicine to your wound while the skin adhesive is in place. This may loosen the film before your wound is healed.  If a dressing is placed over the wound, be careful not to apply tape directly over the skin adhesive. This may cause the adhesive to be pulled off before the wound is healed.  Avoid prolonged exposure to sunlight or tanning lamps while the skin adhesive is in place. Exposure to ultraviolet light in the first year will darken the scar.  The skin adhesive will usually remain in place for 5 to 10 days, then naturally fall off the  skin. Do not pick at the adhesive film. You may need a tetanus shot if:  You cannot remember when you had your last tetanus shot.  You have never had a tetanus shot. If you get a tetanus shot, your arm may swell, get red, and feel warm to the touch. This is common and not a problem. If you need a tetanus shot and you choose not to have one, there is a rare chance of getting tetanus. Sickness from tetanus can be serious. SEEK MEDICAL CARE IF:   You have redness, swelling, or increasing pain in the wound.  You see a red line that goes away from the wound.  You  have yellowish-white fluid (pus) coming from the wound.  You have a fever.  You notice a bad smell coming from the wound or dressing.  Your wound breaks open before or after sutures have been removed.  You notice something coming out of the wound such as wood or glass.  Your wound is on your hand or foot and you cannot move a finger or toe. SEEK IMMEDIATE MEDICAL CARE IF:   Your pain is not controlled with prescribed medicine.  You have severe swelling around the wound causing pain and numbness or a change in color in your arm, hand, leg, or foot.  Your wound splits open and starts bleeding.  You have worsening numbness, weakness, or loss of function of any joint around or beyond the wound.  You develop painful lumps near the wound or on the skin anywhere on your body. MAKE SURE YOU:   Understand these instructions.  Will watch your condition.  Will get help right away if you are not doing well or get worse. Document Released: 11/08/2005 Document Revised: 01/31/2012 Document Reviewed: 05/04/2011 [facility] Patient Information 2015 [place], [place]. This information is not intended to replace advice given to you by your health care provider. Make sure you discuss any questions you have with your health care provider.

## 2016-09-17 ENCOUNTER — Other Ambulatory Visit: Payer: Self-pay | Admitting: Family Medicine

## 2016-09-17 DIAGNOSIS — N644 Mastodynia: Secondary | ICD-10-CM

## 2016-09-29 ENCOUNTER — Ambulatory Visit
Admission: RE | Admit: 2016-09-29 | Discharge: 2016-09-29 | Disposition: A | Discharge status: Home / Self Care | Payer: Managed Care, Other (non HMO) | Source: Ambulatory Visit | Attending: Family Medicine | Admitting: Family Medicine

## 2016-09-29 ENCOUNTER — Other Ambulatory Visit: Payer: Self-pay | Admitting: Family Medicine

## 2016-09-29 DIAGNOSIS — N644 Mastodynia: Secondary | ICD-10-CM

## 2019-03-19 ENCOUNTER — Emergency Department (HOSPITAL_BASED_OUTPATIENT_CLINIC_OR_DEPARTMENT_OTHER): Payer: No Typology Code available for payment source

## 2019-03-19 ENCOUNTER — Emergency Department (HOSPITAL_BASED_OUTPATIENT_CLINIC_OR_DEPARTMENT_OTHER): Payer: Managed Care, Other (non HMO)

## 2019-03-19 ENCOUNTER — Other Ambulatory Visit (HOSPITAL_BASED_OUTPATIENT_CLINIC_OR_DEPARTMENT_OTHER): Payer: Self-pay | Admitting: Radiology

## 2019-03-19 ENCOUNTER — Other Ambulatory Visit: Payer: Self-pay

## 2019-03-19 ENCOUNTER — Emergency Department (HOSPITAL_BASED_OUTPATIENT_CLINIC_OR_DEPARTMENT_OTHER)
Admission: EM | Admit: 2019-03-19 | Discharge: 2019-03-19 | Disposition: A | Discharge status: Home / Self Care | Payer: No Typology Code available for payment source | Attending: Emergency Medicine | Admitting: Emergency Medicine

## 2019-03-19 ENCOUNTER — Encounter (HOSPITAL_BASED_OUTPATIENT_CLINIC_OR_DEPARTMENT_OTHER): Payer: Self-pay | Admitting: Emergency Medicine

## 2019-03-19 DIAGNOSIS — T148XXA Other injury of unspecified body region, initial encounter: Secondary | ICD-10-CM

## 2019-03-19 DIAGNOSIS — S93401A Sprain of unspecified ligament of right ankle, initial encounter: Secondary | ICD-10-CM | POA: Diagnosis not present

## 2019-03-19 DIAGNOSIS — Y929 Unspecified place or not applicable: Secondary | ICD-10-CM | POA: Insufficient documentation

## 2019-03-19 DIAGNOSIS — Y999 Unspecified external cause status: Secondary | ICD-10-CM | POA: Insufficient documentation

## 2019-03-19 DIAGNOSIS — S0083XA Contusion of other part of head, initial encounter: Secondary | ICD-10-CM | POA: Diagnosis not present

## 2019-03-19 DIAGNOSIS — Y9301 Activity, walking, marching and hiking: Secondary | ICD-10-CM | POA: Insufficient documentation

## 2019-03-19 DIAGNOSIS — Z79899 Other long term (current) drug therapy: Secondary | ICD-10-CM | POA: Insufficient documentation

## 2019-03-19 DIAGNOSIS — S99911A Unspecified injury of right ankle, initial encounter: Secondary | ICD-10-CM | POA: Diagnosis present

## 2019-03-19 DIAGNOSIS — R42 Dizziness and giddiness: Secondary | ICD-10-CM | POA: Diagnosis not present

## 2019-03-19 DIAGNOSIS — W101XXA Fall (on)(from) sidewalk curb, initial encounter: Secondary | ICD-10-CM | POA: Insufficient documentation

## 2019-03-19 DIAGNOSIS — I10 Essential (primary) hypertension: Secondary | ICD-10-CM | POA: Diagnosis not present

## 2019-03-19 DIAGNOSIS — J45909 Unspecified asthma, uncomplicated: Secondary | ICD-10-CM | POA: Diagnosis not present

## 2019-03-19 MED ORDER — ACETAMINOPHEN 500 MG PO TABS
1000.0000 mg | ORAL_TABLET | Freq: Once | ORAL | Status: AC
  Administered 2019-03-19: 1000 mg via ORAL
  Filled 2019-03-19: qty 2

## 2019-03-19 NOTE — ED Notes (Signed)
Pt returned from radiology.

## 2019-03-19 NOTE — ED Provider Notes (Signed)
[facility] EMERGENCY DEPARTMENT Provider Note   CSN: [id number] Arrival date & time: 03/19/19  2020    History   Chief Complaint Chief Complaint  Patient presents with  . Fall    HPI Joy Walters is a 38 y.o. female with past medical history of ADHD, anxiety, asthma, depression, hypertension who presents for evaluation of right ankle pain, left head pain after mechanical fall that occurred at approximately 7:30 PM.  Patient reports that she was walking and states that her foot got caught on the curb causing her to fall.  She reports inversion injury of her ankle.  Patient also reports hitting the left side of her head.  She denies any LOC.  Patient states she is not currently on any blood thinners.  She has not been able to ambulate bear weight on the leg since the accident.  Patient reports she has had a previous history of issues with that ankle.  Patient denies any vision changes, chest pain, difficulty breathing, vomiting.  She does have a little bit of numbness to the right foot.  He states that the numbness began initially after an injury.  She says is slightly improved since then but is still there.     The history is provided by the patient.    Past Medical History:  Diagnosis Date  . ADHD (attention deficit hyperactivity disorder)   . Anxiety   . Asthma    prn inhaler  . Depression   . Hypertension    under control with med., has been on med. < 1 yr.  . Limited jaw range of motion    s/p jaw surgery  . 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 Seasonal allergies     There are no active problems to display for this patient.   Past Surgical History:  Procedure Laterality Date  . ANKLE RECONSTRUCTION Right 04/24/2014   Procedure: RECONSTRUCTION ANKLE;  Surgeon: Harvie JuniorJohn L Graves, MD;  Location: [facility];  Service: Orthopedics;  Laterality: Right;  . CHOLECYSTECTOMY  07/25/2009  . LASIK    . MANDIBULAR SAGITTAL SPLIT OSTEOTOMY W/ INTERNAL  FIXATION Bilateral 05/07/2002  . MAXILLARY LE FORTE I OSTEOTOMY  05/07/2002     OB History   No obstetric history on file.      Home Medications    Prior to Admission medications   Medication Sig Start Date End Date Taking? Authorizing Provider  albuterol (PROVENTIL HFA;VENTOLIN HFA) 108 (90 BASE) MCG/ACT inhaler Inhale into the lungs every 6 (six) hours as needed for wheezing or shortness of breath.    [provider]  atomoxetine (STRATTERA) 100 MG capsule Take 100 mg by mouth daily.    [provider]  DULoxetine (CYMBALTA) 60 MG capsule Take 60 mg by mouth daily.    [provider]  ibuprofen (ADVIL,MOTRIN) 200 MG tablet Take 200 mg by mouth every 6 (six) hours as needed for mild pain.     [provider]  loratadine (CLARITIN) 10 MG tablet Take 10 mg by mouth daily.    [provider]  losartan (COZAAR) 50 MG tablet Take 50 mg by mouth daily.    [provider]  methylphenidate (CONCERTA) 27 MG CR tablet Take 1 tablet (27 mg total) by mouth daily. 03/11/14 03/11/15  Gayland Curryadepalli, Gayathri D, MD  montelukast (SINGULAIR) 10 MG tablet Take 10 mg by mouth at bedtime.    [provider]  Multiple Vitamin (MULTIVITAMIN) tablet Take 1 tablet by mouth daily.  [provider]    Family History Family History  Problem Relation Age of Onset  . Depression Mother   . Depression Father   . OCD Father     Social History Social History   Tobacco Use  . Smoking status: Never Smoker  . Smokeless tobacco: Never Used  Substance Use Topics  . Alcohol use: No  . Drug use: No     Allergies   Scallops [shellfish allergy]; Sulfa antibiotics; and Tamiflu [oseltamivir phosphate]   Review of Systems Review of Systems  Eyes: Negative for visual disturbance.  Respiratory: Negative for cough and shortness of breath.   Cardiovascular: Negative for chest pain.  Gastrointestinal: Negative for nausea and vomiting.   Musculoskeletal:       Right ankle pain  Neurological: Positive for numbness. Negative for weakness and headaches.  All other systems reviewed and are negative.    Physical Exam Updated Vital Signs BP (!) 145/64   Pulse 84   Temp 98.6 F (37 C) (Oral)   Resp 16   Ht 5' 10.5" (1.791 m)   Wt (!) 149.7 kg   LMP 02/26/2019 (Exact Date)   SpO2 100%   BMI 46.68 kg/m   Physical Exam Vitals signs and nursing note reviewed.  Constitutional:      Appearance: She is well-developed.  HENT:     Head: Normocephalic and atraumatic.      Comments: Hematoma noted to left frontal area. Eyes:     General: No scleral icterus.       Right eye: No discharge.        Left eye: No discharge.     Conjunctiva/sclera: Conjunctivae normal.     Comments: PERRL. EOMs intact.   Neck:     Musculoskeletal: Normal range of motion.     Comments: Full flexion/extension and lateral movement of neck fully intact. No bony midline tenderness. No deformities or crepitus.  Cardiovascular:     Pulses:          Dorsalis pedis pulses are 2+ on the right side and 2+ on the left side.  Pulmonary:     Effort: Pulmonary effort is normal.  Musculoskeletal:     Comments: Tenderness to palpation noted the lateral aspect of the right lower extremity with overlying soft tissue swelling.  No deformity or crepitus noted.  Limited range of motion secondary to pain.  No tenderness palpation noted distal or proximal tib-fib, right knee.  No tenderness palpation noted to left lower extremity.  Skin:    General: Skin is warm and dry.     Capillary Refill: Capillary refill takes less than 2 seconds.     Comments: Good distal cap refill. RLE is not dusky in appearance or cool to touch.  Neurological:     Mental Status: She is alert.     Comments: Decreased sensation of the lateral aspect of right ankle.  Patient otherwise intact throughout.  Psychiatric:        Speech: Speech normal.        Behavior: Behavior normal.       ED Treatments / Results  Labs (all labs ordered are listed, but only abnormal results are displayed) Labs Reviewed - No data to display  EKG None  Radiology Dg Ankle Complete Right  Result Date: 03/19/2019 CLINICAL DATA:  38 year old female with ankle pain. EXAM: RIGHT ANKLE - COMPLETE 3+ VIEW COMPARISON:  None. FINDINGS: Degenerative spurring at the calcaneus, medial and lateral malleolus. Superimposed 5 millimeter chronic appearing ossific  fragment at the tip of the medial malleolus. Mortise joint alignment preserved. Talar dome intact. No joint effusion is evident. No acute osseous abnormality identified. IMPRESSION: Chronic appearing posttraumatic and degenerative changes. No acute osseous abnormality identified. Electronically Signed   By: Odessa Fleming M.D.   On: 03/19/2019 22:07   Ct Head Wo Contrast  Result Date: 03/19/2019 CLINICAL DATA:  Fall on pavement today. Left head trauma and dizziness. Initial encounter. EXAM: CT HEAD WITHOUT CONTRAST TECHNIQUE: Contiguous axial images were obtained from the base of the skull through the vertex without intravenous contrast. COMPARISON:  None. FINDINGS: Brain: No evidence of acute infarction, hemorrhage, hydrocephalus, extra-axial collection, or mass lesion/mass effect. Vascular:  No hyperdense vessel or other acute findings. Skull: No evidence of fracture or other significant bone abnormality. Sinuses/Orbits:  No acute findings. Other: None. IMPRESSION: Negative noncontrast head CT. Electronically Signed   By: Myles Rosenthal M.D.   On: 03/19/2019 22:05    Procedures Procedures (including critical care time)  Medications Ordered in ED Medications  acetaminophen (TYLENOL) tablet 1,000 mg (1,000 mg Oral Given 03/19/19 2115)     Initial Impression / Assessment and Plan / ED Course  I have reviewed the triage vital signs and the nursing notes.  Pertinent labs & imaging results that were available during my care of the patient were reviewed by me  and considered in my medical decision making (see chart for details).        38 year old female who presents for evaluation after mechanical fall.  Patient reports pain on left side of head and right ankle.  No LOC.  She is not currently on blood thinners. Patient is afebrile, non-toxic appearing, sitting comfortably on examination table. Vital signs reviewed and stable.  Patient with tenderness palpation of the right lateral malleolus.  Patient also has hematoma noted left head and some tenderness noted to the orbital region.  Will plan for head CT, ankle x-ray.  X-ray reviewed.  Negative for any acute bony abnormality.  There is mention of posttraumatic changes consistent with her previous injury.  Head CT negative for any acute intracranial abnormality.  No evidence of orbital fracture.  Discussed results with patient.  Discussed with patient that she could still have a sprain or ligamentous injury.  Patient states that she Artie has an ASO and crutches at home.  Instructed her to use them. At this time, patient exhibits no emergent life-threatening condition that require further evaluation in ED or admission. Patient had ample opportunity for questions and discussion. All patient's questions were answered with full understanding. Strict return precautions discussed. Patient expresses understanding and agreement to plan.   Portions of this note were generated with Scientist, clinical (histocompatibility and immunogenetics). Dictation errors may occur despite best attempts at proofreading.   Final Clinical Impressions(s) / ED Diagnoses   Final diagnoses:  Sprain of right ankle, unspecified ligament, initial encounter  Hematoma    ED Discharge Orders    None       Rosana Hoes 03/19/19 2327    Rolan Bucco, MD 03/20/19 1458

## 2019-03-19 NOTE — ED Notes (Signed)
Per EDP, pt has crutches at home

## 2019-03-19 NOTE — ED Notes (Signed)
Patient transported to X-ray 

## 2019-03-19 NOTE — Discharge Instructions (Signed)
Follow up with your Primary Care Doctor as needed.   You can take Tylenol or Ibuprofen as directed for pain. You can alternate Tylenol and Ibuprofen every 4 hours. If you take Tylenol at 1pm, then you can take Ibuprofen at 5pm. Then you can take Tylenol again at 9pm. Do not exceed 4000 mg of tylenol a day. Do not exceed 800 mg of ibuprofen a day.     Return to the Emergency Department immediately for any worsening pain, redness/swelling of the ankle, gray or blue color to the toes, numbness/weakness of toes or foot, difficulty walking or any other worsening or concerning symptoms.    Ankle sprain Ankle sprain occurs when the ligaments that hold the ankle joint to get her are stretched or torn. It may take 4-6 weeks to heal.  For activity: Use crutches with nonweightbearing for the first few days. Then, you may walk on your ankles as the pain allows, or as instructed. Start gradually with weight bearing on the affected ankle. Once you can walk pain free, then try jogging. When you can run forwards, then you can try moving side to side. If you cannot walk without crutches in one week, you need a recheck by your Family Doctor.  If you do not have a family doctor to followup with, you can see the list of phone numbers below. Please call today to make a followup appointment.   RICE therapy:  Routine Care for injuries  Rest, Ice, Compression, Elevation (RICE)  Rest is needed to allow your body to heal. Routine activities can be resumed when comfortable. Injury tendons and bones can take up to 6 weeks to heal. Tendons are cordlike structures that attach muscles and bones.  Ice following an injury helps keep the swelling down and reduce the pain. Put ice in a plastic bag. Place a towel between your skin and the bag of ice. Leave the ice on for 15-20 minutes, 3-4 times a day. Do this while awake, for the first 24-48 hours. After that continue as directed by your caregiver.  Compression helps keep  swelling down. It also gives support and helps with discomfort. If any lasting bandage has been applied, it should be removed and reapplied every 3-4 hours. It should not be applied tightly, but firmly enough to keep swelling down. Watch fingers or toes for swelling, discoloration, coldness, numbness or excessive pain. If any of these problems occur, removed the bandage and reapply loosely. Contact your caregiver if these problems continue.  Elevation helps reduce swelling and decrease your pain. With extremities such as the arms, hands, legs and feet, the injured area should be placed near or above the level of the heart if possible.   

## 2019-03-19 NOTE — ED Triage Notes (Signed)
Pt reports falling on uneven pavement. Pt reports rolling right ankle and hitting left side of head on ground. Pt has small hematoma to left side of head and pain in right ankle.

## 2019-03-19 NOTE — ED Notes (Signed)
Pt has an ASO at home and does not want another

## 2019-06-30 ENCOUNTER — Encounter (INDEPENDENT_AMBULATORY_CARE_PROVIDER_SITE_OTHER): Payer: Self-pay

## 2019-06-30 ENCOUNTER — Telehealth: Payer: BLUE CROSS/BLUE SHIELD | Admitting: Nurse Practitioner

## 2019-06-30 DIAGNOSIS — R0602 Shortness of breath: Secondary | ICD-10-CM | POA: Diagnosis not present

## 2019-06-30 DIAGNOSIS — R6889 Other general symptoms and signs: Secondary | ICD-10-CM | POA: Diagnosis not present

## 2019-06-30 DIAGNOSIS — Z20822 Contact with and (suspected) exposure to covid-19: Secondary | ICD-10-CM

## 2019-06-30 MED ORDER — ALBUTEROL SULFATE HFA 108 (90 BASE) MCG/ACT IN AERS: 2.0000 | INHALATION_SPRAY | Freq: Four times a day (QID) | RESPIRATORY_TRACT | 0 refills | Status: AC | PRN

## 2019-06-30 NOTE — Progress Notes (Signed)
E-Visit for Corona Virus Screening   Your current symptoms could be consistent with the coronavirus.  Many health care providers can now test patients at their office but not all are.  [facility] has multiple testing sites. For information on our COVID testing locations and hours go to [email or web address]  Please quarantine yourself while awaiting your test results.  We are enrolling you in our [place] for [place] . Daily you will receive a questionnaire within the [place] website. Our COVID 19 response team willl be monitoriing your responses daily.  You can go to one of the  testing sites listed below, while they are opened (see hours). You do not need an order for covid testing.  You do need to self-isolate until your results return and if positive 14 days from when your symptoms started and until you are 3 days symptom free.   Testing Locations (Monday - Friday, 8 a.m. - 3:30 p.m.) . [place]: [facility], [address] [place], [place]: [place], [place], [place], [place] (entrance off [address])  . [place] [address] [place], [place] (across from [facility] Department)   COVID-19 is a respiratory illness with symptoms that are similar to the flu. Symptoms are typically mild to moderate, but there have been cases of severe illness and death due to the virus. The following symptoms may appear 2-14 days after exposure: . Fever . Cough . Shortness of breath or difficulty breathing . Chills . Repeated shaking with chills . Muscle pain . Headache . Sore throat . New loss of taste or smell . Fatigue . Congestion or runny nose . Nausea or vomiting . Diarrhea  It is vitally important that if you feel that you have an infection such as this virus or any other virus that you stay home and away from places where you may spread it to  others.  You should self-quarantine for 14 days if you have symptoms that could potentially be coronavirus or have been in close contact a with a person diagnosed with COVID-19 within the last 2 weeks. You should avoid contact with people age 36 and older.   You should wear a mask or cloth face covering over your nose and mouth if you must be around other people or animals, including pets (even at home). Try to stay at least 6 feet away from other people. This will protect the people around you.  You can use medication such as A prescription inhaler called Albuterol MDI 90 mcg /actuation 2 puffs every 4 hours as needed for shortness of breath, wheezing, cough  You may also take acetaminophen (Tylenol) as needed for fever.   Reduce your risk of any infection by using the same precautions used for avoiding the common cold or flu:  Marland Kitchen Wash your hands often with soap and warm water for at least 20 seconds.  If soap and water are not readily available, use an alcohol-based hand sanitizer with at least 60% alcohol.  . If coughing or sneezing, cover your mouth and nose by coughing or sneezing into the elbow areas of your shirt or coat, into a tissue or into your sleeve (not your hands). . Avoid shaking hands with others and consider head nods or verbal greetings only. . Avoid touching your eyes, nose, or mouth with unwashed hands.  . Avoid close contact with people who are sick. . Avoid places or events  with large numbers of people in one location, like concerts or sporting events. . Carefully consider travel plans you have or are making. . If you are planning any travel outside or inside the [place], visit the CDC's Travelers' Health webpage for the latest health notices. . If you have some symptoms but not all symptoms, continue to monitor at home and seek medical attention if your symptoms worsen. . If you are having a medical emergency, call 911.  HOME CARE . Only take medications as instructed by your  medical team. . Drink plenty of fluids and get plenty of rest. . A steam or ultrasonic humidifier can help if you have congestion.   GET HELP RIGHT AWAY IF YOU HAVE EMERGENCY WARNING SIGNS** FOR COVID-19. If you or someone is showing any of these signs seek emergency medical care immediately. Call 911 or proceed to your closest emergency facility if: . You develop worsening high fever. . Trouble breathing . Bluish lips or face . Persistent pain or pressure in the chest . New confusion . Inability to wake or stay awake . You cough up blood. . Your symptoms become more severe  **This list is not all possible symptoms. Contact your medical provider for any symptoms that are sever or concerning to you.   MAKE SURE YOU   Understand these instructions.  Will watch your condition.  Will get help right away if you are not doing well or get worse.  Your e-visit answers were reviewed by a board certified advanced clinical practitioner to complete your personal care plan.  Depending on the condition, your plan could have included both over the counter or prescription medications.  If there is a problem please reply once you have received a response from your provider.  Your safety is important to us.  If you have drug allergies check your prescription carefully.    You can use MyChart to ask questions about today's visit, request a non-urgent call back, or ask for a work or school excuse for 24 hours related to this e-Visit. If it has been greater than 24 hours you will need to follow up with your provider, or enter a new e-Visit to address those concerns. You will get an e-mail in the next two days asking about your experience.  I hope that your e-visit has been valuable and will speed your recovery. Thank you for using e-visits.   5-10 minutes spent reviewing and documenting in chart.

## 2019-07-01 ENCOUNTER — Encounter (INDEPENDENT_AMBULATORY_CARE_PROVIDER_SITE_OTHER): Payer: Self-pay

## 2019-07-02 ENCOUNTER — Other Ambulatory Visit: Payer: Self-pay

## 2019-07-02 DIAGNOSIS — Z20822 Contact with and (suspected) exposure to covid-19: Secondary | ICD-10-CM

## 2019-07-03 ENCOUNTER — Encounter (INDEPENDENT_AMBULATORY_CARE_PROVIDER_SITE_OTHER): Payer: Self-pay

## 2019-07-03 LAB — NOVEL CORONAVIRUS, NAA: SARS-CoV-2, NAA: NOT DETECTED

## 2019-07-04 ENCOUNTER — Encounter (INDEPENDENT_AMBULATORY_CARE_PROVIDER_SITE_OTHER): Payer: Self-pay

## 2019-07-05 ENCOUNTER — Encounter (INDEPENDENT_AMBULATORY_CARE_PROVIDER_SITE_OTHER): Payer: Self-pay

## 2019-07-07 ENCOUNTER — Encounter (INDEPENDENT_AMBULATORY_CARE_PROVIDER_SITE_OTHER): Payer: Self-pay

## 2019-07-08 ENCOUNTER — Encounter (INDEPENDENT_AMBULATORY_CARE_PROVIDER_SITE_OTHER): Payer: Self-pay

## 2019-07-09 ENCOUNTER — Encounter (INDEPENDENT_AMBULATORY_CARE_PROVIDER_SITE_OTHER): Payer: Self-pay

## 2019-07-10 ENCOUNTER — Encounter (INDEPENDENT_AMBULATORY_CARE_PROVIDER_SITE_OTHER): Payer: Self-pay

## 2019-07-12 ENCOUNTER — Encounter (INDEPENDENT_AMBULATORY_CARE_PROVIDER_SITE_OTHER): Payer: Self-pay

## 2019-07-13 ENCOUNTER — Encounter (INDEPENDENT_AMBULATORY_CARE_PROVIDER_SITE_OTHER): Payer: Self-pay

## 2019-12-09 IMAGING — CT CT HEAD WITHOUT CONTRAST
3 series · 15 of 47 positions shown, 18 images · non-contrast
Comparison: None.

CLINICAL DATA: Fall on pavement today. Left head trauma and
dizziness. Initial encounter.

EXAM:
CT HEAD WITHOUT CONTRAST
TECHNIQUE: Contiguous axial images were obtained from the base of the skull
through the vertex without intravenous contrast.

[Series 2: head wo · axial · 0.44mm/px · z∈[+1150,+1280]mm · 9 of 32 slices shown, 12 images]
[im 3/32  brain]
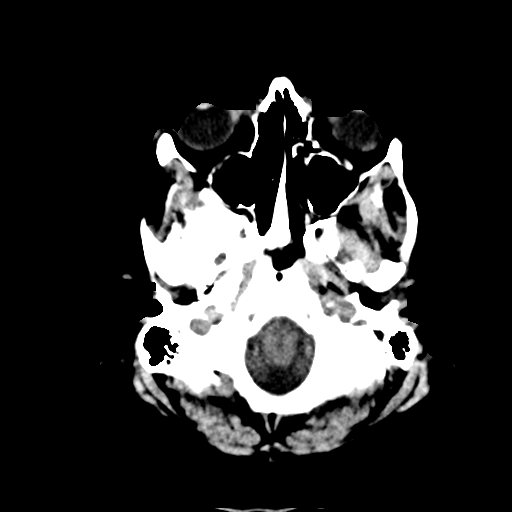
[im 3/32  bone]
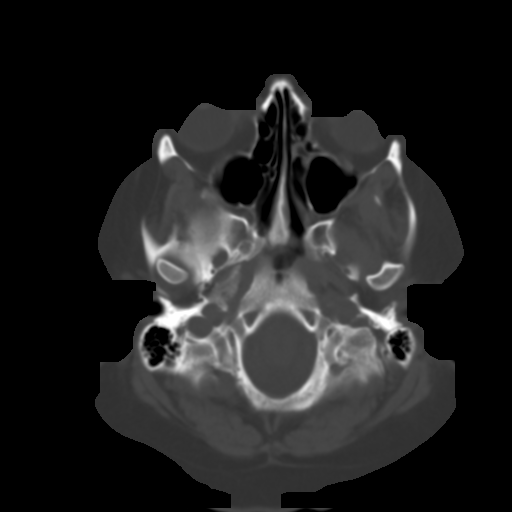
[im 6/32  brain]
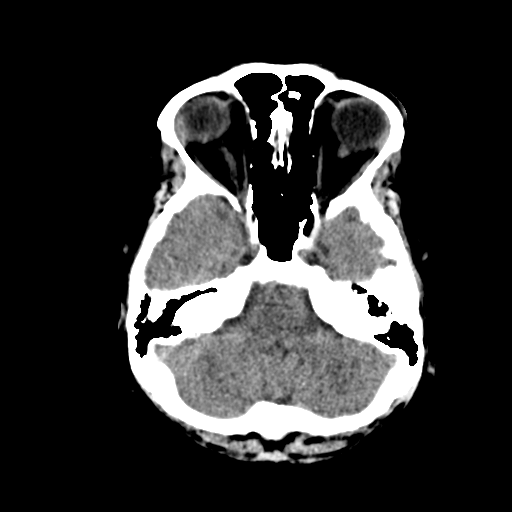
[im 9/32  brain]
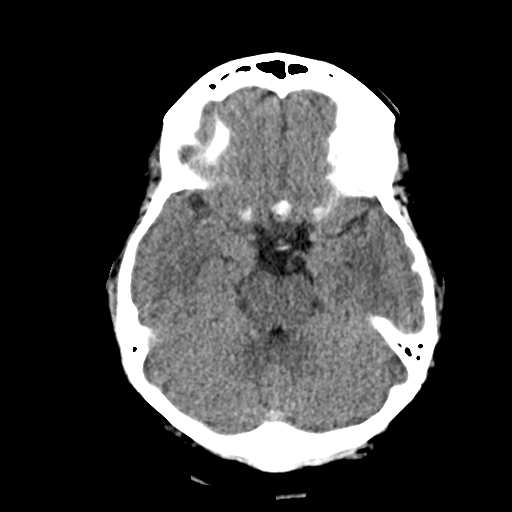
[im 12/32  brain]
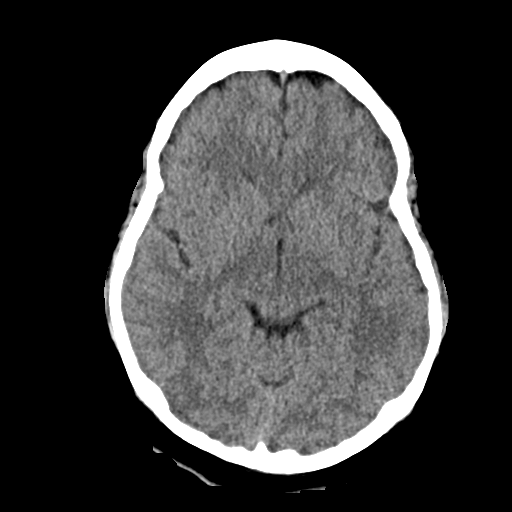
[im 17/32  brain]
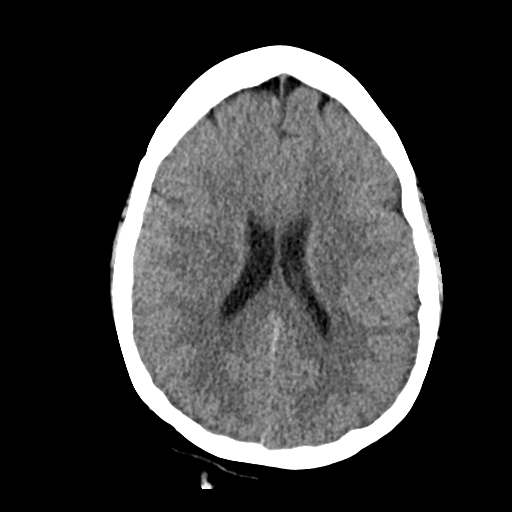
[im 17/32  bone]
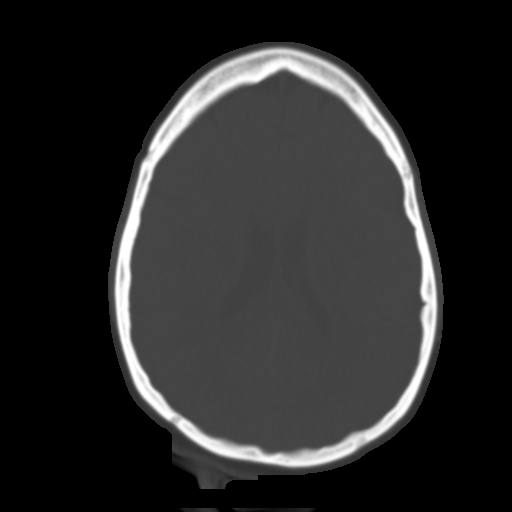
[im 20/32  brain]
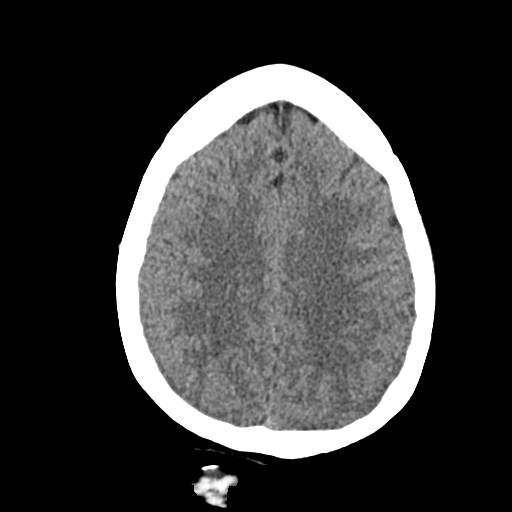
[im 23/32  brain]
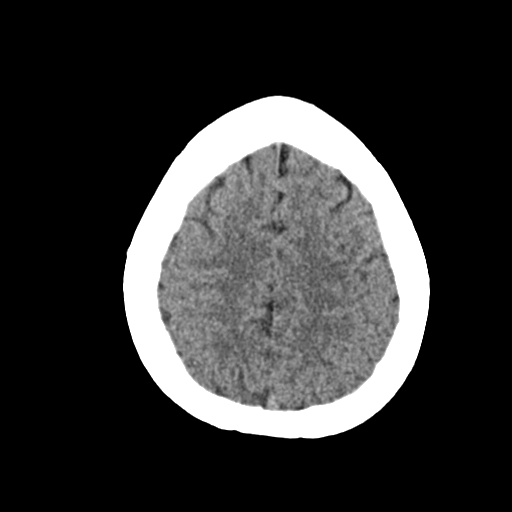
[im 26/32  brain]
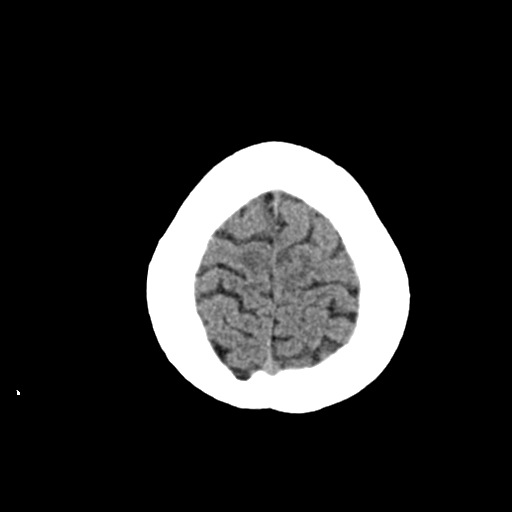
[im 29/32  brain]
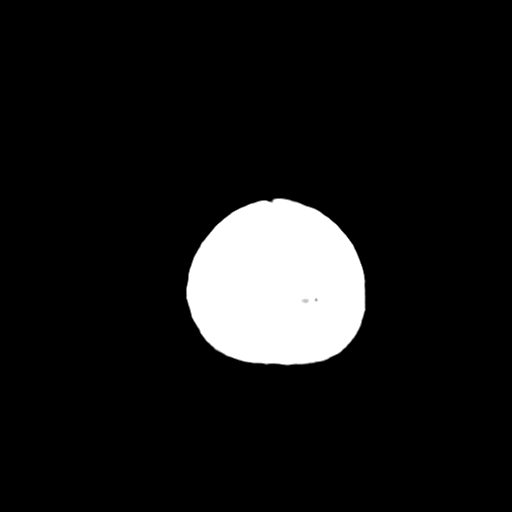
[im 29/32  bone]
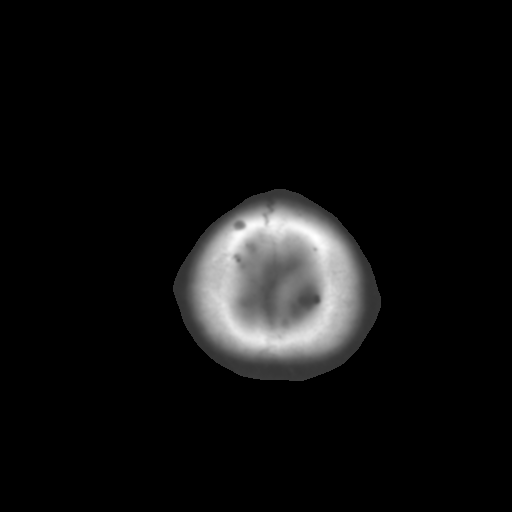

[Series 4: coronal soft · coronal · 0.31mm/px · 3 of 67 slices shown]
[im 23/67  brain]
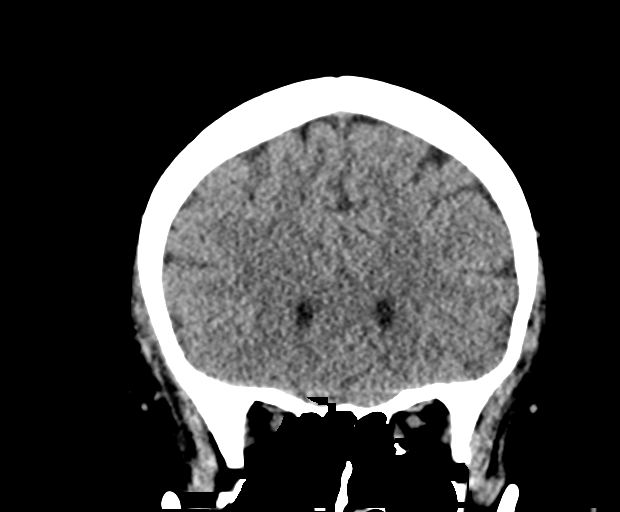
[im 30/67  brain]
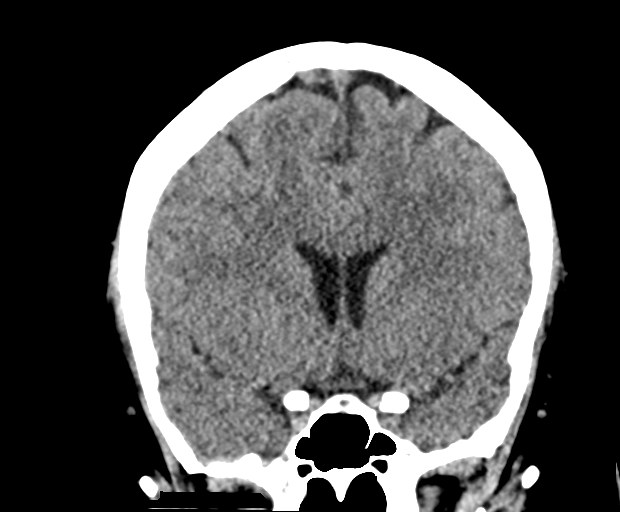
[im 37/67  brain]
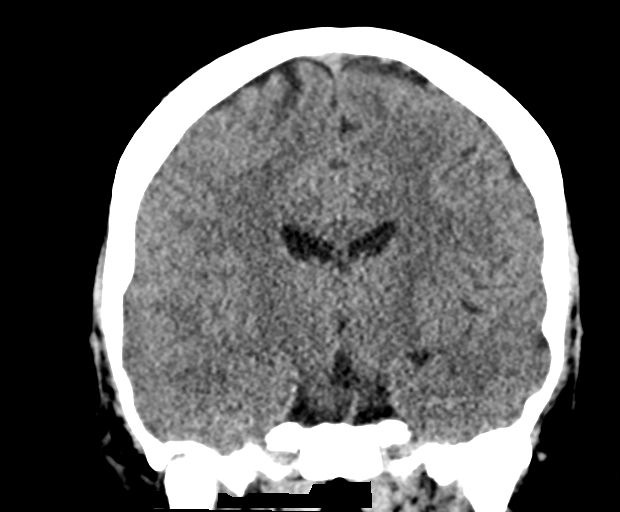

[Series 5: sag soft · sagittal · 0.31mm/px · 3 of 64 slices shown]
[im 22/64  brain]
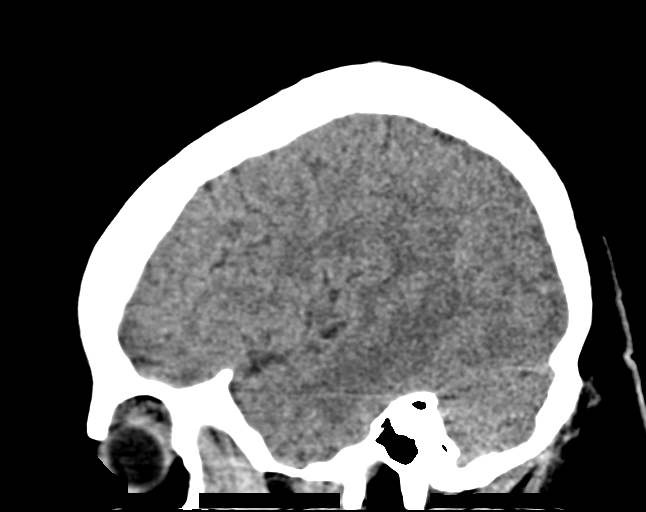
[im 32/64  brain]
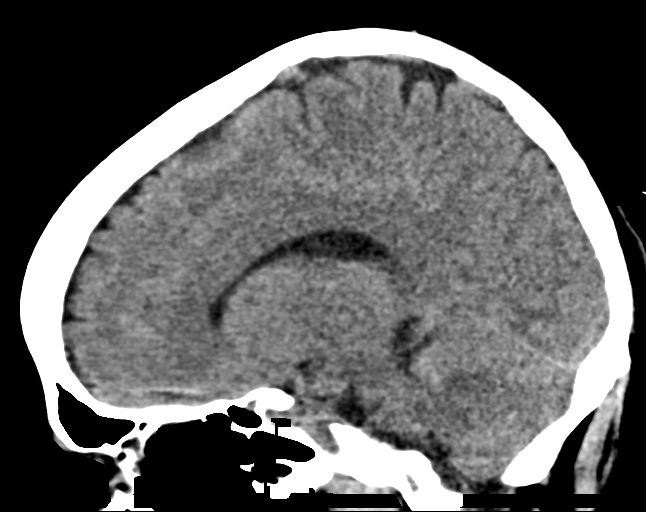
[im 43/64  brain]
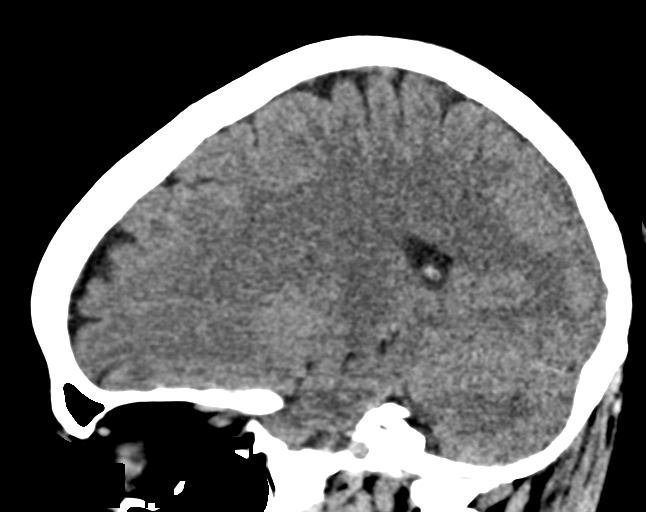

[15 of 47 positions shown; findings below may reference images not displayed]

FINDINGS: Brain: No evidence of acute infarction, hemorrhage, hydrocephalus,
extra-axial collection, or mass lesion/mass effect.

Vascular:  No hyperdense vessel or other acute findings.

Skull: No evidence of fracture or other significant bone
abnormality.

Sinuses/Orbits:  No acute findings.

Other: None.
IMPRESSION: Negative noncontrast head CT.

## 2019-12-09 IMAGING — DX RIGHT ANKLE - COMPLETE 3+ VIEW
3 series · 3 of 3 positions shown · non-contrast
Comparison: None.

CLINICAL DATA: 37-year-old female with ankle pain.

EXAM:
RIGHT ANKLE - COMPLETE 3+ VIEW

[ankle ap]
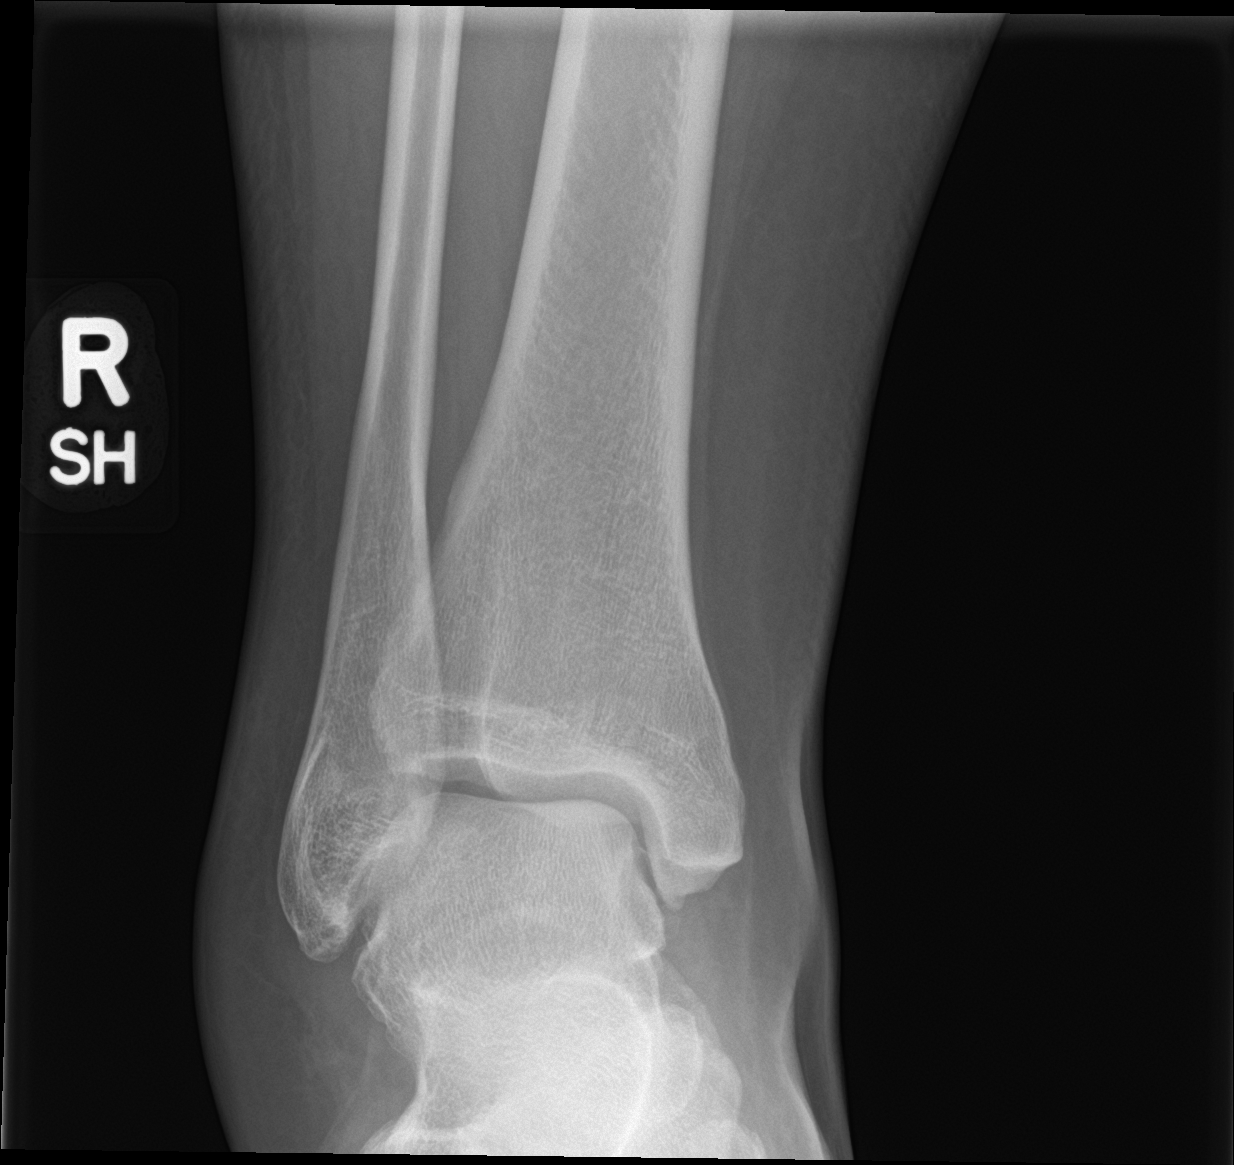

[ankle obl]
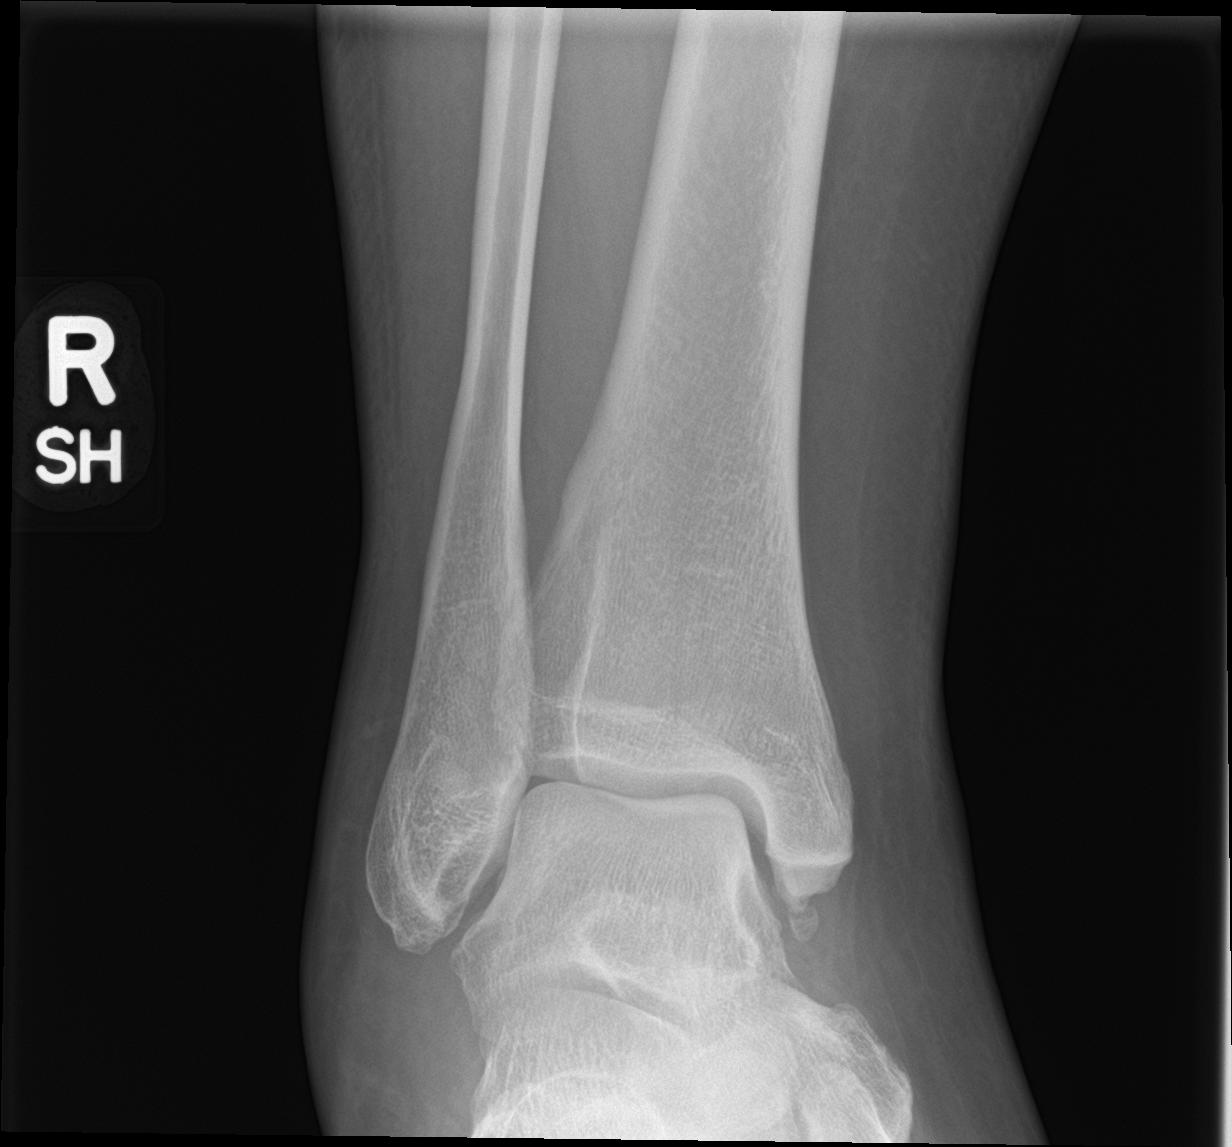

[ankle lat]
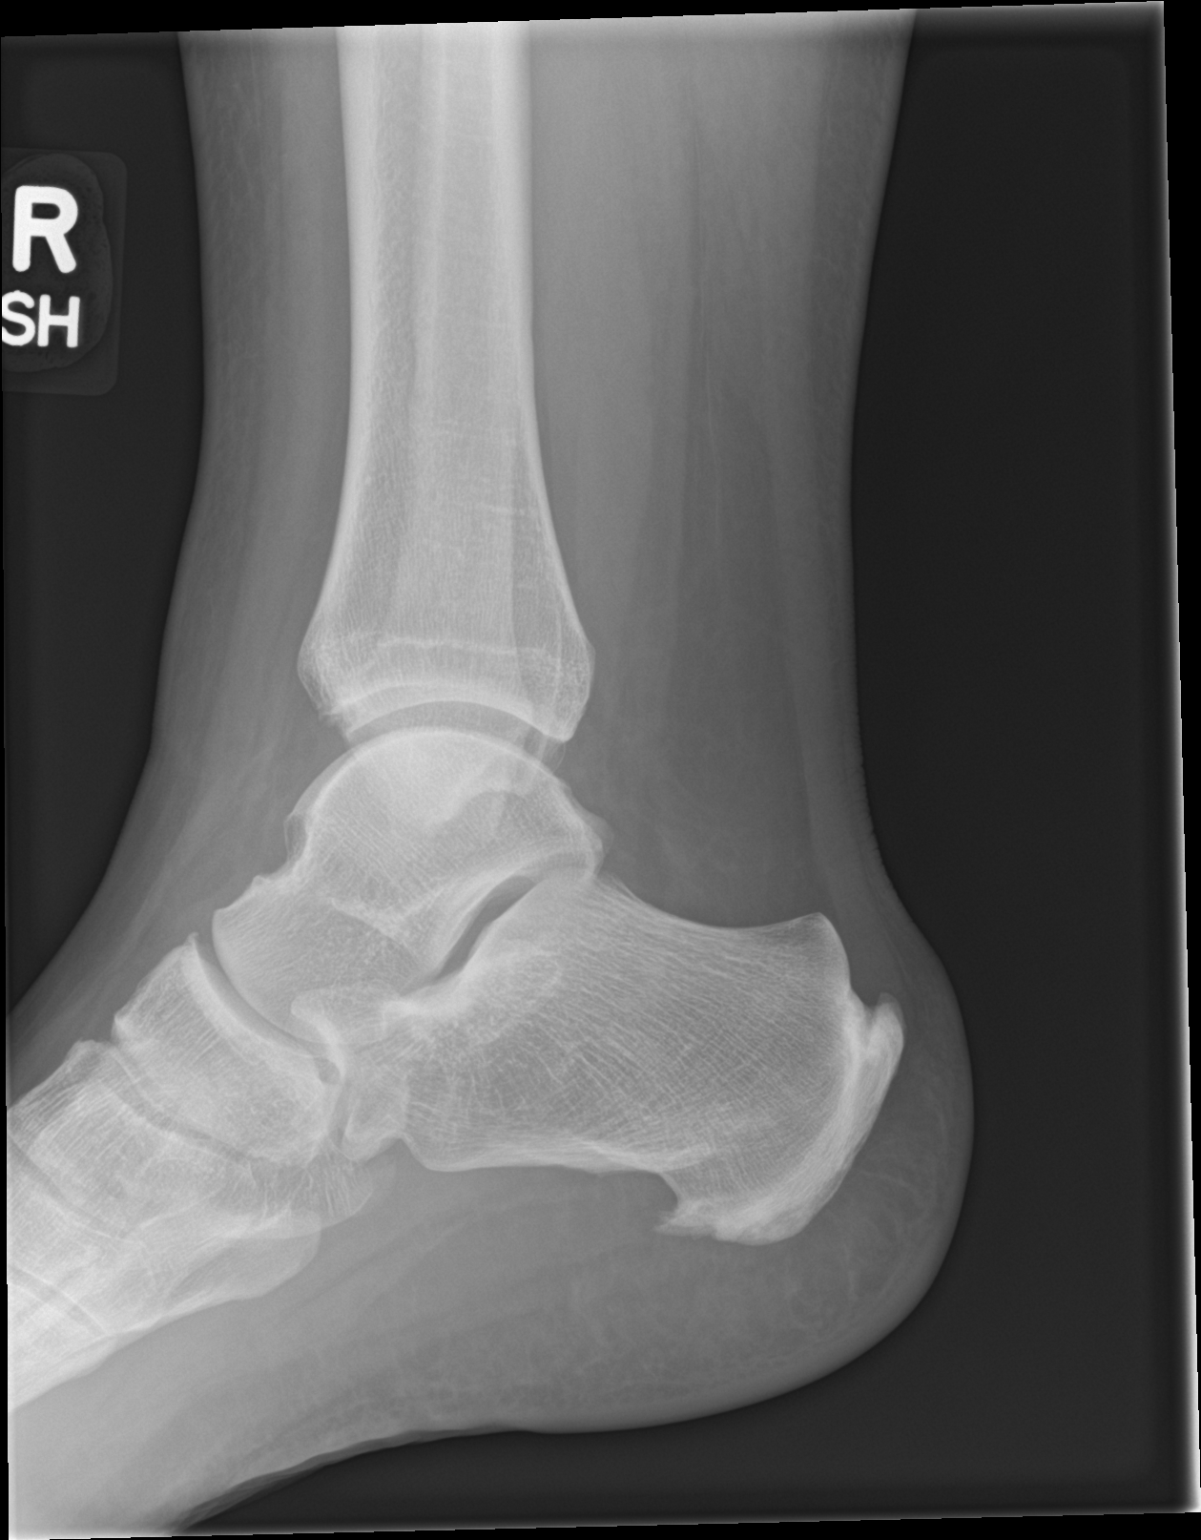

[3 of 3 positions shown; findings below may reference images not displayed]

FINDINGS: Degenerative spurring at the calcaneus, medial and lateral
malleolus. Superimposed 5 millimeter chronic appearing ossific
fragment at the tip of the medial malleolus. Mortise joint alignment
preserved. Talar dome intact. No joint effusion is evident. No acute
osseous abnormality identified.
IMPRESSION: Chronic appearing posttraumatic and degenerative changes. No acute
osseous abnormality identified.
